# Patient Record
Sex: Female | Born: 1995 | Race: White | Hispanic: No | Marital: Single | Smoking: Former smoker
Health system: Southern US, Community
[De-identification: ages and names within clinical notes are randomized; demographics above are authoritative.]

## PROBLEM LIST (undated history)

## (undated) DIAGNOSIS — G43909 Migraine, unspecified, not intractable, without status migrainosus: Secondary | ICD-10-CM

## (undated) DIAGNOSIS — L709 Acne, unspecified: Secondary | ICD-10-CM

## (undated) DIAGNOSIS — F32A Depression, unspecified: Secondary | ICD-10-CM

## (undated) DIAGNOSIS — F329 Major depressive disorder, single episode, unspecified: Secondary | ICD-10-CM

## (undated) HISTORY — PX: WISDOM TOOTH EXTRACTION: SHX21

---
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
## 2015-04-15 ENCOUNTER — Ambulatory Visit (INDEPENDENT_AMBULATORY_CARE_PROVIDER_SITE_OTHER): Payer: No Typology Code available for payment source | Admitting: Women's Health

## 2015-04-15 ENCOUNTER — Encounter: Payer: Self-pay | Admitting: Women's Health

## 2015-04-15 VITALS — BP 112/70 | Ht 66.25 in | Wt 141.0 lb

## 2015-04-15 DIAGNOSIS — Z01419 Encounter for gynecological examination (general) (routine) without abnormal findings: Secondary | ICD-10-CM | POA: Diagnosis not present

## 2015-04-15 DIAGNOSIS — Z30018 Encounter for initial prescription of other contraceptives: Secondary | ICD-10-CM | POA: Diagnosis not present

## 2015-04-15 LAB — CBC WITH DIFFERENTIAL/PLATELET
BASOS ABS: 0 10*3/uL (ref 0.0–0.1)
Basophils Relative: 0 % (ref 0–1)
EOS ABS: 0.1 10*3/uL (ref 0.0–0.7)
EOS PCT: 1 % (ref 0–5)
HEMATOCRIT: 38.5 % (ref 36.0–46.0)
HEMOGLOBIN: 12.8 g/dL (ref 12.0–15.0)
Lymphocytes Relative: 34 % (ref 12–46)
Lymphs Abs: 2.6 10*3/uL (ref 0.7–4.0)
MCH: 27.5 pg (ref 26.0–34.0)
MCHC: 33.2 g/dL (ref 30.0–36.0)
MCV: 82.8 fL (ref 78.0–100.0)
MONOS PCT: 7 % (ref 3–12)
MPV: 10.8 fL (ref 8.6–12.4)
Monocytes Absolute: 0.5 10*3/uL (ref 0.1–1.0)
NEUTROS ABS: 4.4 10*3/uL (ref 1.7–7.7)
NEUTROS PCT: 58 % (ref 43–77)
Platelets: 328 10*3/uL (ref 150–400)
RBC: 4.65 MIL/uL (ref 3.87–5.11)
RDW: 15.3 % (ref 11.5–15.5)
WBC: 7.5 10*3/uL (ref 4.0–10.5)

## 2015-04-15 MED ORDER — ETONOGESTREL-ETHINYL ESTRADIOL 0.12-0.015 MG/24HR VA RING: VAGINAL_RING | VAGINAL | Status: DC

## 2015-04-15 NOTE — Progress Notes (Signed)
Claudia Hughes 12-30-1995 [id number]    History:    Presents for annual exam.  Regular monthly cycle Yaz in the past, stopped had trouble remembering not sexually active. Gardasil series completed. 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  Past medical history, past surgical history, family history and social history were all reviewed and documented in the EPIC chart. Freshman [employer].  ROS:  A ROS was performed and pertinent positives and negatives are included.  Exam:  Filed Vitals:   04/15/15 0818  BP: 112/70    General appearance:  Normal Thyroid:  Symmetrical, normal in size, without palpable masses or nodularity. Respiratory  Auscultation:  Clear without wheezing or rhonchi Cardiovascular  Auscultation:  Regular rate, without rubs, murmurs or gallops  Edema/varicosities:  Not grossly evident Abdominal  Soft,nontender, without masses, guarding or rebound.  Liver/spleen:  No organomegaly noted  Hernia:  None appreciated  Skin  Inspection:  Grossly normal   Breasts: Examined lying and sitting.     Right: Without masses, retractions, discharge or axillary adenopathy.     Left: Without masses, retractions, discharge or axillary adenopathy. Gentitourinary   Inguinal/mons:  Normal without inguinal adenopathy  External genitalia:  Normal  BUS/Urethra/Skene's glands:  Normal  Vagina:  Normal  Cervix:  Normal  Uterus:   normal in size, shape and contour.  Midline and mobile  Adnexa/parametria:     Rt: Without masses or tenderness.   Lt: Without masses or tenderness.  Anus and perineum: Normal    Assessment/Plan:  19 y.o. S WF G0 for annual exam.     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 Condoms encouraged if sexually active and especially first month. SBE's, exercise, calcium rich diet, MVI daily  encouraged. Campus safety reviewed. CBC, UAHarrington Challenger WHNP, 11:41 AM 04/15/2015

## 2015-04-15 NOTE — Patient Instructions (Signed)

## 2015-05-10 ENCOUNTER — Other Ambulatory Visit: Payer: Self-pay

## 2015-09-09 ENCOUNTER — Telehealth: Payer: Self-pay | Admitting: *Deleted

## 2015-09-09 DIAGNOSIS — Z30018 Encounter for initial prescription of other contraceptives: Secondary | ICD-10-CM

## 2015-09-09 MED ORDER — ETONOGESTREL-ETHINYL ESTRADIOL 0.12-0.015 MG/24HR VA RING
VAGINAL_RING | VAGINAL | Status: DC
Start: 2015-09-09 — End: 2016-04-27

## 2015-09-09 NOTE — Telephone Encounter (Signed)
Pt called requesting her birth control nuvaring be sent to local pharmacy. [facility]. Rx sent.

## 2016-04-27 ENCOUNTER — Encounter: Payer: Self-pay | Admitting: Women's Health

## 2016-04-27 ENCOUNTER — Ambulatory Visit (INDEPENDENT_AMBULATORY_CARE_PROVIDER_SITE_OTHER): Payer: BLUE CROSS/BLUE SHIELD | Admitting: Women's Health

## 2016-04-27 VITALS — BP 120/80 | Ht 66.0 in | Wt 146.0 lb

## 2016-04-27 DIAGNOSIS — Z01419 Encounter for gynecological examination (general) (routine) without abnormal findings: Secondary | ICD-10-CM

## 2016-04-27 DIAGNOSIS — Z113 Encounter for screening for infections with a predominantly sexual mode of transmission: Secondary | ICD-10-CM

## 2016-04-27 DIAGNOSIS — Z30018 Encounter for initial prescription of other contraceptives: Secondary | ICD-10-CM | POA: Diagnosis not present

## 2016-04-27 LAB — CBC WITH DIFFERENTIAL/PLATELET
BASOS ABS: 0 {cells}/uL (ref 0–200)
Basophils Relative: 0 %
EOS ABS: 50 {cells}/uL (ref 15–500)
Eosinophils Relative: 1 %
HEMATOCRIT: 39.3 % (ref 35.0–45.0)
HEMOGLOBIN: 12.8 g/dL (ref 11.7–15.5)
LYMPHS ABS: 1800 {cells}/uL (ref 850–3900)
LYMPHS PCT: 36 %
MCH: 28.1 pg (ref 27.0–33.0)
MCHC: 32.6 g/dL (ref 32.0–36.0)
MCV: 86.2 fL (ref 80.0–100.0)
MONO ABS: 350 {cells}/uL (ref 200–950)
MPV: 10.5 fL (ref 7.5–12.5)
Monocytes Relative: 7 %
NEUTROS PCT: 56 %
Neutro Abs: 2800 cells/uL (ref 1500–7800)
Platelets: 315 10*3/uL (ref 140–400)
RBC: 4.56 MIL/uL (ref 3.80–5.10)
RDW: 14.4 % (ref 11.0–15.0)
WBC: 5 10*3/uL (ref 3.8–10.8)

## 2016-04-27 MED ORDER — ETONOGESTREL-ETHINYL ESTRADIOL 0.12-0.015 MG/24HR VA RING: VAGINAL_RING | VAGINAL | 4 refills | Status: DC

## 2016-04-27 NOTE — Patient Instructions (Signed)
Health Maintenance, Female Adopting a healthy lifestyle and getting preventive care can go a long way to promote health and wellness. Talk with your health care provider about what schedule of regular examinations is right for you. This is a good chance for you to check in with your provider about disease prevention and staying healthy. In between checkups, there are plenty of things you can do on your own. Experts have done a lot of research about which lifestyle changes and preventive measures are most likely to keep you healthy. Ask your health care provider for more information. WEIGHT AND DIET  Eat a healthy diet  Be sure to include plenty of vegetables, fruits, low-fat dairy products, and lean protein.  Do not eat a lot of foods high in solid fats, added sugars, or salt.  Get regular exercise. This is one of the most important things you can do for your health.  Most adults should exercise for at least 150 minutes each week. The exercise should increase your heart rate and make you sweat (moderate-intensity exercise).  Most adults should also do strengthening exercises at least twice a week. This is in addition to the moderate-intensity exercise.  Maintain a healthy weight  Body mass index (BMI) is a measurement that can be used to identify possible weight problems. It estimates body fat based on height and weight. Your health care provider can help determine your BMI and help you achieve or maintain a healthy weight.  For females 20 years of age and older:   A BMI below 18.5 is considered underweight.  A BMI of 18.5 to 24.9 is normal.  A BMI of 25 to 29.9 is considered overweight.  A BMI of 30 and above is considered obese.  Watch levels of cholesterol and blood lipids  You should start having your blood tested for lipids and cholesterol at 20 years of age, then have this test every 5 years.  You may need to have your cholesterol levels checked more often if:  Your lipid  or cholesterol levels are high.  You are older than 20 years of age.  You are at high risk for heart disease.  CANCER SCREENING   Lung Cancer  Lung cancer screening is recommended for adults 55-80 years old who are at high risk for lung cancer because of a history of smoking.  A yearly low-dose CT scan of the lungs is recommended for people who:  Currently smoke.  Have quit within the past 15 years.  Have at least a 30-pack-year history of smoking. A pack year is smoking an average of one pack of cigarettes a day for 1 year.  Yearly screening should continue until it has been 15 years since you quit.  Yearly screening should stop if you develop a health problem that would prevent you from having lung cancer treatment.  Breast Cancer  Practice breast self-awareness. This means understanding how your breasts normally appear and feel.  It also means doing regular breast self-exams. Let your health care provider know about any changes, no matter how small.  If you are in your 20s or 30s, you should have a clinical breast exam (CBE) by a health care provider every 1-3 years as part of a regular health exam.  If you are 40 or older, have a CBE every year. Also consider having a breast X-ray (mammogram) every year.  If you have a family history of breast cancer, talk to your health care provider about genetic screening.  If you   are at high risk for breast cancer, talk to your health care provider about having an MRI and a mammogram every year.  Breast cancer gene (BRCA) assessment is recommended for women who have family members with BRCA-related cancers. BRCA-related cancers include:  Breast.  Ovarian.  Tubal.  Peritoneal cancers.  Results of the assessment will determine the need for genetic counseling and BRCA1 and BRCA2 testing. Cervical Cancer Your health care provider may recommend that you be screened regularly for cancer of the pelvic organs (ovaries, uterus, and  vagina). This screening involves a pelvic examination, including checking for microscopic changes to the surface of your cervix (Pap test). You may be encouraged to have this screening done every 3 years, beginning at age 21.  For women ages 30-65, health care providers may recommend pelvic exams and Pap testing every 3 years, or they may recommend the Pap and pelvic exam, combined with testing for human papilloma virus (HPV), every 5 years. Some types of HPV increase your risk of cervical cancer. Testing for HPV may also be done on women of any age with unclear Pap test results.  Other health care providers may not recommend any screening for nonpregnant women who are considered low risk for pelvic cancer and who do not have symptoms. Ask your health care provider if a screening pelvic exam is right for you.  If you have had past treatment for cervical cancer or a condition that could lead to cancer, you need Pap tests and screening for cancer for at least 20 years after your treatment. If Pap tests have been discontinued, your risk factors (such as having a new sexual partner) need to be reassessed to determine if screening should resume. Some women have medical problems that increase the chance of getting cervical cancer. In these cases, your health care provider may recommend more frequent screening and Pap tests. Colorectal Cancer  This type of cancer can be detected and often prevented.  Routine colorectal cancer screening usually begins at 20 years of age and continues through 20 years of age.  Your health care provider may recommend screening at an earlier age if you have risk factors for colon cancer.  Your health care provider may also recommend using home test kits to check for hidden blood in the stool.  A small camera at the end of a tube can be used to examine your colon directly (sigmoidoscopy or colonoscopy). This is done to check for the earliest forms of colorectal  cancer.  Routine screening usually begins at age 50.  Direct examination of the colon should be repeated every 5-10 years through 20 years of age. However, you may need to be screened more often if early forms of precancerous polyps or small growths are found. Skin Cancer  Check your skin from head to toe regularly.  Tell your health care provider about any new moles or changes in moles, especially if there is a change in a mole's shape or color.  Also tell your health care provider if you have a mole that is larger than the size of a pencil eraser.  Always use sunscreen. Apply sunscreen liberally and repeatedly throughout the day.  Protect yourself by wearing long sleeves, pants, a wide-brimmed hat, and sunglasses whenever you are outside. HEART DISEASE, DIABETES, AND HIGH BLOOD PRESSURE   High blood pressure causes heart disease and increases the risk of stroke. High blood pressure is more likely to develop in:  People who have blood pressure in the high end   of the normal range (130-139/85-89 mm Hg).  People who are overweight or obese.  People who are African American.  If you are 38-23 years of age, have your blood pressure checked every 3-5 years. If you are 61 years of age or older, have your blood pressure checked every year. You should have your blood pressure measured twice--once when you are at a hospital or clinic, and once when you are not at a hospital or clinic. Record the average of the two measurements. To check your blood pressure when you are not at a hospital or clinic, you can use:  An automated blood pressure machine at a pharmacy.  A home blood pressure monitor.  If you are between 45 years and 39 years old, ask your health care provider if you should take aspirin to prevent strokes.  Have regular diabetes screenings. This involves taking a blood sample to check your fasting blood sugar level.  If you are at a normal weight and have a low risk for diabetes,  have this test once every three years after 20 years of age.  If you are overweight and have a high risk for diabetes, consider being tested at a younger age or more often. PREVENTING INFECTION  Hepatitis B  If you have a higher risk for hepatitis B, you should be screened for this virus. You are considered at high risk for hepatitis B if:  You were born in a country where hepatitis B is common. Ask your health care provider which countries are considered high risk.  Your parents were born in a high-risk country, and you have not been immunized against hepatitis B (hepatitis B vaccine).  You have HIV or AIDS.  You use needles to inject street drugs.  You live with someone who has hepatitis B.  You have had sex with someone who has hepatitis B.  You get hemodialysis treatment.  You take certain medicines for conditions, including cancer, organ transplantation, and autoimmune conditions. Hepatitis C  Blood testing is recommended for:  Everyone born from [birth year] through 1965.  Anyone with known risk factors for hepatitis C. Sexually transmitted infections (STIs)  You should be screened for sexually transmitted infections (STIs) including gonorrhea and chlamydia if:  You are sexually active and are younger than 20 years of age.  You are older than 20 years of age and your health care provider tells you that you are at risk for this type of infection.  Your sexual activity has changed since you were last screened and you are at an increased risk for chlamydia or gonorrhea. Ask your health care provider if you are at risk.  If you do not have HIV, but are at risk, it may be recommended that you take a prescription medicine daily to prevent HIV infection. This is called pre-exposure prophylaxis (PrEP). You are considered at risk if:  You are sexually active and do not regularly use condoms or know the HIV status of your partner(s).  You take drugs by injection.  You are sexually  active with a partner who has HIV. Talk with your health care provider about whether you are at high risk of being infected with HIV. If you choose to begin PrEP, you should first be tested for HIV. You should then be tested every 3 months for as long as you are taking PrEP.  PREGNANCY   If you are premenopausal and you may become pregnant, ask your health care provider about preconception counseling.  If you may  become pregnant, take 400 to 800 micrograms (mcg) of folic acid every day.  If you want to prevent pregnancy, talk to your health care provider about birth control (contraception). OSTEOPOROSIS AND MENOPAUSE   Osteoporosis is a disease in which the bones lose minerals and strength with aging. This can result in serious bone fractures. Your risk for osteoporosis can be identified using a bone density scan.  If you are 61 years of age or older, or if you are at risk for osteoporosis and fractures, ask your health care provider if you should be screened.  Ask your health care provider whether you should take a calcium or vitamin D supplement to lower your risk for osteoporosis.  Menopause may have certain physical symptoms and risks.  Hormone replacement therapy may reduce some of these symptoms and risks. Talk to your health care provider about whether hormone replacement therapy is right for you.  HOME CARE INSTRUCTIONS   Schedule regular health, dental, and eye exams.  Stay current with your immunizations.   Do not use any tobacco products including cigarettes, chewing tobacco, or electronic cigarettes.  If you are pregnant, do not drink alcohol.  If you are breastfeeding, limit how much and how often you drink alcohol.  Limit alcohol intake to no more than 1 drink per day for nonpregnant women. One drink equals 12 ounces of beer, 5 ounces of wine, or 1 ounces of hard liquor.  Do not use street drugs.  Do not share needles.  Ask your health care provider for help if  you need support or information about quitting drugs.  Tell your health care provider if you often feel depressed.  Tell your health care provider if you have ever been abused or do not feel safe at home.   This information is not intended to replace advice given to you by your health care provider. Make sure you discuss any questions you have with your health care provider.   Document Released: 03/13/2011 Document Revised: 09/18/2014 Document Reviewed: 07/30/2013 Elsevier Interactive Patient Education [employer].

## 2016-04-27 NOTE — Progress Notes (Signed)
Claudia Hughes [birth year] [id number]    History:    Presents for annual exam.  Please cycle on NuvaRing without complaint. New partner. Gardasil series completed. 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  Past medical history, past surgical history, family history and social history were all reviewed and documented in the EPIC chart. Sophomore at [employer] doing well. Parents healthy.  ROS:  A ROS was performed and pertinent positives and negatives are included.  Exam:  Vitals:   04/27/16 1106  BP: 120/80  Weight: 146 lb (66.2 kg)  Height: 5\' 6"  (1.676 m)   Body mass index is 23.57 kg/m.   General appearance:  Normal Thyroid:  Symmetrical, normal in size, without palpable masses or nodularity. Respiratory  Auscultation:  Clear without wheezing or rhonchi Cardiovascular  Auscultation:  Regular rate, without rubs, murmurs or gallops  Edema/varicosities:  Not grossly evident Abdominal  Soft,nontender, without masses, guarding or rebound.  Liver/spleen:  No organomegaly noted  Hernia:  None appreciated  Skin  Inspection:  Grossly normal   Breasts: Examined lying and sitting.     Right: Without masses, retractions, discharge or axillary adenopathy.     Left: Without masses, retractions, discharge or axillary adenopathy. Gentitourinary   Inguinal/mons:  Normal without inguinal adenopathy  External genitalia:  Normal  BUS/Urethra/Skene's glands:  Normal  Vagina:  Normal  Cervix:  Normal  Uterus:   normal in size, shape and contour.  Midline and mobile  Adnexa/parametria:     Rt: Without masses or tenderness.   Lt: Without masses or tenderness.  Anus and perineum: Normal   Assessment/Plan:  20 y.o. SBF G0  for annual exam. If no complaints.  Monthly cycle on NuvaRing STD screen  Plan:  NuvaRing prescription, proper use, slight risk for blood clots and strokes reviewed. Condoms encouraged until permanent partner. SBE's, exercise, calcium rich diet, MVI daily encouraged. Campus  safety reviewed. CBC, UA, GC/Chlamydia, HIV, hep B, C, RPRHarrington Challenger.  Ethin Drummond J WHNP, 11:44 AM 04/27/2016

## 2016-04-28 LAB — HIV ANTIBODY (ROUTINE TESTING W REFLEX): HIV: NONREACTIVE

## 2016-04-28 LAB — HEPATITIS C ANTIBODY: HCV Ab: NEGATIVE

## 2016-04-28 LAB — RPR

## 2016-04-28 LAB — HEPATITIS B SURFACE ANTIGEN: Hepatitis B Surface Ag: NEGATIVE

## 2016-04-28 LAB — GC/CHLAMYDIA PROBE AMP
CT Probe RNA: NOT DETECTED
GC PROBE AMP APTIMA: NOT DETECTED

## 2016-06-11 ENCOUNTER — Emergency Department (HOSPITAL_COMMUNITY)
Admission: EM | Admit: 2016-06-11 | Discharge: 2016-06-12 | Disposition: A | Discharge status: Other Acute Inpatient Hospital | Payer: BLUE CROSS/BLUE SHIELD | Attending: Emergency Medicine | Admitting: Emergency Medicine

## 2016-06-11 ENCOUNTER — Encounter (HOSPITAL_COMMUNITY): Payer: Self-pay

## 2016-06-11 DIAGNOSIS — F17210 Nicotine dependence, cigarettes, uncomplicated: Secondary | ICD-10-CM | POA: Diagnosis not present

## 2016-06-11 DIAGNOSIS — R45851 Suicidal ideations: Secondary | ICD-10-CM

## 2016-06-11 DIAGNOSIS — F329 Major depressive disorder, single episode, unspecified: Secondary | ICD-10-CM | POA: Diagnosis not present

## 2016-06-11 HISTORY — DX: Major depressive disorder, single episode, unspecified: F32.9

## 2016-06-11 HISTORY — DX: Depression, unspecified: F32.A

## 2016-06-11 LAB — COMPREHENSIVE METABOLIC PANEL
ALK PHOS: 43 U/L (ref 38–126)
ALT: 11 U/L — AB (ref 14–54)
AST: 14 U/L — AB (ref 15–41)
Albumin: 4.2 g/dL (ref 3.5–5.0)
Anion gap: 7 (ref 5–15)
BILIRUBIN TOTAL: 0.4 mg/dL (ref 0.3–1.2)
BUN: 12 mg/dL (ref 6–20)
CALCIUM: 9.5 mg/dL (ref 8.9–10.3)
CHLORIDE: 109 mmol/L (ref 101–111)
CO2: 22 mmol/L (ref 22–32)
CREATININE: 0.55 mg/dL (ref 0.44–1.00)
GFR calc Af Amer: >60 mL/min (ref >60)
Glucose, Bld: 90 mg/dL (ref 65–99)
Potassium: 3.7 mmol/L (ref 3.5–5.1)
Sodium: 138 mmol/L (ref 135–145)
TOTAL PROTEIN: 7.7 g/dL (ref 6.5–8.1)

## 2016-06-11 LAB — SALICYLATE LEVEL: Salicylate Lvl: <4 mg/dL (ref 2.8–30.0)

## 2016-06-11 LAB — CBC
HCT: 38.1 % (ref 36.0–46.0)
Hemoglobin: 12.9 g/dL (ref 12.0–15.0)
MCH: 28.2 pg (ref 26.0–34.0)
MCHC: 33.9 g/dL (ref 30.0–36.0)
MCV: 83.4 fL (ref 78.0–100.0)
PLATELETS: 321 10*3/uL (ref 150–400)
RBC: 4.57 MIL/uL (ref 3.87–5.11)
RDW: 13.4 % (ref 11.5–15.5)
WBC: 9 10*3/uL (ref 4.0–10.5)

## 2016-06-11 LAB — ETHANOL

## 2016-06-11 LAB — ACETAMINOPHEN LEVEL: Acetaminophen (Tylenol), Serum: <10 ug/mL — ABNORMAL LOW (ref 10–30)

## 2016-06-11 MED ORDER — IBUPROFEN 200 MG PO TABS: 600.0000 mg | ORAL_TABLET | Freq: Three times a day (TID) | ORAL | Status: DC | PRN

## 2016-06-11 NOTE — BH Assessment (Signed)
Assessor attempted to contact the pt again via the TTS machine but the pt cannot be reached.  Claudia Hughes, MSW, Theresia MajorsLCSWA

## 2016-06-11 NOTE — ED Notes (Signed)
Bed: [facility] Expected date:  Expected time:  Means of arrival:  Comments: 

## 2016-06-11 NOTE — ED Triage Notes (Signed)
Patient bib her mother.  Patient states that within the last 3 weeks she has been depressed.  Patient states that came home from school because of increasing depression.  Patient states that since she has been home has stated that she would rather be "gone" instead of here.  Patient denies A/V/H, denies pain.  Patient is tearful in triage.
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
## 2016-06-11 NOTE — BH Assessment (Signed)
Assessor spoke with Claudia Hughes who requested a call back in about 10 minutes to conduct the TTS assessment.  Princess BruinsAquicha Duff, MSW, Theresia MajorsLCSWA

## 2016-06-11 NOTE — BH Assessment (Signed)
Assessor attempted to contact pt to conduct TTS assessment but was able to reach the pt. Will attempt to call back in approx 5 mins.  Princess BruinsAquicha Duff, MSW, Theresia MajorsLCSWA

## 2016-06-11 NOTE — ED Notes (Signed)
Patient changed into scrubs and ambulatory to restroom.

## 2016-06-12 ENCOUNTER — Encounter (HOSPITAL_COMMUNITY): Payer: Self-pay

## 2016-06-12 ENCOUNTER — Inpatient Hospital Stay (HOSPITAL_COMMUNITY)
Admission: AD | Admit: 2016-06-12 | Discharge: 2016-06-13 | DRG: 885 | Disposition: A | Discharge status: Home / Self Care | Payer: BLUE CROSS/BLUE SHIELD | Source: Intra-hospital | Attending: Psychiatry | Admitting: Psychiatry

## 2016-06-12 DIAGNOSIS — Z8489 Family history of other specified conditions: Secondary | ICD-10-CM | POA: Diagnosis not present

## 2016-06-12 DIAGNOSIS — F17210 Nicotine dependence, cigarettes, uncomplicated: Secondary | ICD-10-CM | POA: Diagnosis present

## 2016-06-12 DIAGNOSIS — F1210 Cannabis abuse, uncomplicated: Secondary | ICD-10-CM | POA: Diagnosis present

## 2016-06-12 DIAGNOSIS — F329 Major depressive disorder, single episode, unspecified: Secondary | ICD-10-CM | POA: Diagnosis present

## 2016-06-12 DIAGNOSIS — F322 Major depressive disorder, single episode, severe without psychotic features: Secondary | ICD-10-CM | POA: Diagnosis present

## 2016-06-12 DIAGNOSIS — R45851 Suicidal ideations: Secondary | ICD-10-CM | POA: Diagnosis present

## 2016-06-12 DIAGNOSIS — G43009 Migraine without aura, not intractable, without status migrainosus: Secondary | ICD-10-CM | POA: Diagnosis present

## 2016-06-12 DIAGNOSIS — Z30018 Encounter for initial prescription of other contraceptives: Secondary | ICD-10-CM

## 2016-06-12 DIAGNOSIS — Z79899 Other long term (current) drug therapy: Secondary | ICD-10-CM

## 2016-06-12 DIAGNOSIS — Z791 Long term (current) use of non-steroidal anti-inflammatories (NSAID): Secondary | ICD-10-CM | POA: Diagnosis not present

## 2016-06-12 LAB — TSH: TSH: 1.629 u[IU]/mL (ref 0.350–4.500)

## 2016-06-12 MED ORDER — ACETAMINOPHEN 325 MG PO TABS: 650.0000 mg | ORAL_TABLET | Freq: Four times a day (QID) | ORAL | Status: DC | PRN

## 2016-06-12 MED ORDER — TRAZODONE HCL 50 MG PO TABS
50.0000 mg | ORAL_TABLET | Freq: Every evening | ORAL | Status: DC | PRN
  Administered 2016-06-12: 50 mg via ORAL
  Filled 2016-06-12: qty 1

## 2016-06-12 MED ORDER — HYDROXYZINE HCL 25 MG PO TABS
25.0000 mg | ORAL_TABLET | Freq: Three times a day (TID) | ORAL | Status: DC | PRN
  Administered 2016-06-12 – 2016-06-13 (×3): 25 mg via ORAL
  Filled 2016-06-12 (×3): qty 1

## 2016-06-12 MED ORDER — ALUM & MAG HYDROXIDE-SIMETH 200-200-20 MG/5ML PO SUSP: 30.0000 mL | ORAL | Status: DC | PRN

## 2016-06-12 MED ORDER — MAGNESIUM HYDROXIDE 400 MG/5ML PO SUSP: 30.0000 mL | Freq: Every day | ORAL | Status: DC | PRN

## 2016-06-12 MED ORDER — CITALOPRAM HYDROBROMIDE 10 MG PO TABS
10.0000 mg | ORAL_TABLET | Freq: Every day | ORAL | Status: DC
  Administered 2016-06-12 – 2016-06-13 (×2): 10 mg via ORAL
  Filled 2016-06-12 (×4): qty 1

## 2016-06-12 NOTE — Progress Notes (Signed)
D: Claudia Hughes rates Anxiety and Depression 5/10. Her goal today was to try to feel better and not to stay in her room all day. Denies SI/HI/AVH at this time. Contracts for safety.  A: Encouragement and support given. Q15 minute room checks for patient safety. Medications administered as prescribed.  R: Continue to monitor for patient safety and medication effectiveness.

## 2016-06-12 NOTE — Progress Notes (Signed)
Recreation Therapy Notes  Date: 06/12/16  Time: 0930 Location: 300 Hall Dayroom  Group Topic: Stress Management  Goal Area(s) Addresses:  Patient will verbalize importance of using healthy stress management.  Patient will identify positive emotions associated with healthy stress management.   Intervention: Guided Imagery  Activity :  Engineer, technical saleseaceful Place Imagery.  LRT introduced to the technique of guided imagery to patients.  LRT read a script so patients could participate in the technique.  Patients were to follow along as LRT read script.  Education:  Stress Management, Discharge Planning.   Education Outcome: Acknowledges edcuation/In group clarification offered/Needs additional education  Clinical Observations/Feedback: Pt did not attend group.      Caroll RancherMarjette Jacobie Stamey, LRT/CTRS         Caroll RancherLindsay, Izan Miron A 06/12/2016 12:02 PM

## 2016-06-12 NOTE — Progress Notes (Signed)
D:  Patient's self inventory sheet, patient has fair sleep, no sleep medication given.  Good appetite, normal energy level, good concentration.  Rated depression 4, denied hopeless, anxiety 8.  Denied withdrawals.  Denied SI.  Denied physical pain.  Denied pain.  Goal is getting out and going to appointments.  Plans to talk to MD.  Does have discharge plans. A:  Medications administered per MD orders.  Emotional support and encouragement given patient. R:  Denied SI and HI, contracts for safety.  Denied A/V hallucinations.  Safety maintained with 15 minute checks.

## 2016-06-12 NOTE — [facility] Group Notes (Signed)
Adult Psychoeducational Group Note  Date:  06/12/2016 Time:  1:30 PM  Overcoming Obstacles  Participation Level:  Minimal  Participation Quality:  Minimal  Affect:  Depressed  Cognitive:  Appropriate  Insight: Improving  Engagement in Group:  Developing/Improving  Modes of Intervention:  Discussion, Exploration and Support  Additional Comments: Today's Topic: Overcoming Obstacles. Patients identified one short term goal and potential obstacles in reaching this goal. Patients processed barriers involved in overcoming these obstacles. Patients identified steps necessary for overcoming these obstacles and explored motivation (internal and external) for facing these difficulties head on. Pt encouraged to come to group by roommate, attended 10 minutes at end of group, listened quietly.    Sallee Langenne C Cunningham 06/12/2016, 5:44 PM

## 2016-06-12 NOTE — ED Notes (Signed)
[facility]

## 2016-06-12 NOTE — [facility] Suicide Risk Assessment (Signed)
[facility] Admission Suicide Risk Assessment   Nursing information obtained from:  Patient Demographic factors:  Adolescent or young adult, Unemployed, Caucasian Current Mental Status:  Self-harm thoughts Loss Factors:  NA Historical Factors:  Family history of mental illness or substance abuse Risk Reduction Factors:  Living with another person, especially a relative  Total Time spent with patient: 45 minutes Principal Problem:  MDD  Diagnosis:   Patient Active Problem List   Diagnosis Date Noted  . Major depressive disorder, single episode, severe (HCC) [F32.2] 06/12/2016  . Suicidal ideation [R45.851] 06/12/2016  . 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 Migraine without aura, without mention of intractable migraine without mention of status migrainosus [G43.009] 03/19/2013  . Episodic tension type headache [G44.219] 03/19/2013  . Variants of migraine, not elsewhere classified, without mention of intractable migraine without mention of status migrainosus [G43.809] 03/19/2013  . Acne [L70.9] 03/03/2013     Continued Clinical Symptoms:    The "Alcohol Use Disorders Identification Test", Guidelines for Use in Primary Care, Second Edition.  World Science writer [facility]). Score between 0-7:  no or low risk or alcohol related problems. Score between 8-15:  moderate risk of alcohol related problems. Score between 16-19:  high risk of alcohol related problems. Score 20 or above:  warrants further diagnostic evaluation for alcohol dependence and treatment.   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   Patient states she has been feeling increasingly depressed over recent weeks . She states that her depression started due to problems with her college roommates, whom she felt were " ganging up on her ", being overly critical, and making life difficult for her. She states she made decision to leave college  and return to parents' home last week.  States she came to hospital at the encouragement of her parents, who have been very concerned about her and about her returning to college. Reports she has been sad, often tearful, with low energy level, a sense of low self esteem. Denies any suicidal pans or intention, but states she has had thoughts such as " maybe things would be easier if I die"  This is her first psychiatric admission,denies any history of suicide attempts, denies history of self cutting, no history of psychosis, denies mania, denies panic or agoraphobia. States she was prescribed Zoloft a few years ago, for depression, but took it only briefly .   Smokes cannabis 2 x week, she has a history of binge drinking in college - up to three times a week, with some blackouts.   Denies any medical issues, NKDA.  Mother and brother have history of depression, and brother attempted suicide   Dx- MDD , no psychotic features  Plan - inpatient admission - we discussed options, agrees to antidepressant trial. Start CELEXA 10 mgrs QDAY initially. Also , at patient's request, I spoke with her father via phone. Family very involved- will try to arrange for a family meeting with patient and parents tomorrow.     Musculoskeletal: Strength & Muscle Tone: within normal limits Gait & Station: normal Patient leans: N/A  Psychiatric Specialty Exam: Physical Exam  Review of Systems  Constitutional: Negative.   HENT: Negative.   Eyes: Negative.   Respiratory: Negative.   Cardiovascular: Negative.   Gastrointestinal: Negative.   Genitourinary: Negative.   Musculoskeletal: Negative.   Skin: Negative.   Neurological: Negative for seizures.  Endo/Heme/Allergies: Negative.   Psychiatric/Behavioral:  Positive for depression and suicidal ideas.  All other systems reviewed and are negative.   Blood pressure 118/75, pulse 65, temperature 98.2 F (36.8 C), temperature source Oral, resp. rate 18, height  5\' 6"  (1.676 m), weight 145 lb (65.8 kg), last menstrual period 06/05/2016.Body mass index is 23.4 kg/m.  General Appearance: Fairly Groomed  Eye Contact:  Fair  Speech:  Normal Rate  Volume:  Decreased  Mood:  depressed, but states she is feeling better   Affect:  constricted, but briefly reactive   Thought Process:  Linear  Orientation:  Full (Time, Place, and Person)  Thought Content:  no hallucinations, no delusions, not internally preoccupied   Suicidal Thoughts:  No- at this time she denies suicidal plan or intention and contracts for safety, she has had passive SI recently, as noted above   Homicidal Thoughts:  No  Memory:  recent and remote grossly intact   Judgement:  Fair  Insight:  Fair  Psychomotor Activity:  Decreased  Concentration:  Concentration: Good and Attention Span: Good  Recall:  Good  Fund of Knowledge:  Good  Language:  Good  Akathisia:  Negative  Handed:  Right  AIMS (if indicated):     Assets:  Desire for Improvement Resilience  ADL's:  Intact  Cognition:  WNL  Sleep:  Number of Hours: 2.25      COGNITIVE FEATURES THAT CONTRIBUTE TO RISK:  Closed-mindedness and Loss of executive function    SUICIDE RISK:   Moderate:  Frequent suicidal ideation with limited intensity, and duration, some specificity in terms of plans, no associated intent, good self-control, limited dysphoria/symptomatology, some risk factors present, and identifiable protective factors, including available and accessible social support.   PLAN OF CARE: Patient will be admitted to inpatient psychiatric unit for stabilization and safety. Will provide and encourage milieu participation. Provide medication management and maked adjustments as needed.  Will follow daily.    I certify that inpatient services furnished can reasonably be expected to improve the patient's condition.  Nehemiah MassedOBOS, Eulalia Ellerman, MD 06/12/2016, 11:57 AM

## 2016-06-12 NOTE — H&P (Signed)
Psychiatric Admission Assessment Adult  Patient Identification: Claudia Hughes MRN:  [id number] Date of Evaluation:  06/13/2016 Chief Complaint:  mdd Principal Diagnosis: Major depressive disorder, single episode, severe ([place]) Diagnosis:   Patient Active Problem List   Diagnosis Date Noted  . Major depressive disorder, single episode, severe ([place]) [F32.2] 06/12/2016    Priority: High  . Suicidal ideation [R45.851] 06/12/2016  . 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 Migraine without aura, without mention of intractable migraine without mention of status migrainosus [G43.009] 03/19/2013  . Episodic tension type headache [G44.219] 03/19/2013  . Variants of migraine, not elsewhere classified, without mention of intractable migraine without mention of status migrainosus [G43.809] 03/19/2013  . Acne [L70.9] 03/03/2013   History of Present Illness: Claudia Hughes is an 20 y.o. female who presents to the ED voluntarily. Pt reports she has been feeling depressed recently after she got into an argument with her friends.  She felt isolated by them which worsened her depression.  Pt reports she has been feeling suicidal but denies a current plan. Pt denies H/I.  Patient states that she  Is seeing a therapist at [facility] that she has been seeing since the Summer.  Pt reports she left school and  moved back home about 3 weeks ago to avoid her friends.  Patient denies prior suicide attempts and became tearful when she reported that her brother attempted suicide. Pt reports she has struggled with depression for several years and reports she has never been inpatient.   Associated Signs/Symptoms: Depression Symptoms:  depressed mood, difficulty concentrating, hopelessness, anxiety, (Hypo) Manic Symptoms:  Irritable Mood, Labiality of Mood, Anxiety Symptoms:  Excessive Worry, Psychotic Symptoms:  NA PTSD Symptoms: NA Total Time spent with patient: 30  minutes  Past Psychiatric History: see HPI  Is the patient at risk to self? Yes.    Has the patient been a risk to self in the past 6 months? No.  Has the patient been a risk to self within the distant past? No.  Is the patient a risk to others? Yes.    Has the patient been a risk to others in the past 6 months? Yes.    Has the patient been a risk to others within the distant past? No.   Prior Inpatient Therapy:   Prior Outpatient Therapy:    Alcohol Screening: 1. How often do you have a drink containing alcohol?: 4 or more times a week 2. How many drinks containing alcohol do you have on a typical day when you are drinking?: 1 or 2 3. How often do you have six or more drinks on one occasion?: Less than monthly Preliminary Score: 1 Brief Intervention: AUDIT score less than 7 or less-screening does not suggest unhealthy drinking-brief intervention not indicated Substance Abuse History in the last 12 months:  Yes.   Consequences of Substance Abuse: NA Previous Psychotropic Medications: Yes  Psychological Evaluations: Yes  Past Medical History:  Past Medical History:  Diagnosis Date  . Acne   . Depression   . Migraines     Past Surgical History:  Procedure Laterality Date  . WISDOM TOOTH EXTRACTION     Family History:  Family History  Problem Relation Age of Onset  . Migraines Mother   . Migraines Other     Maternal Event organiser   Family Psychiatric  History: see HPI Tobacco Screening: Have you used any form of tobacco in the last 30 days? (Cigarettes, Smokeless Tobacco, Cigars,  and/or Pipes): Yes Tobacco use, Select all that apply: 4 or less cigarettes per day Are you interested in Tobacco Cessation Medications?: No, patient refused Counseled patient on smoking cessation including recognizing danger situations, developing coping skills and basic information about quitting provided: Refused/Declined practical counseling Social History:  History  Alcohol Use No      History  Drug Use  . Types: Marijuana    Comment: pt reports smokes a few times a week.    Additional Social History: Marital status: Single Does patient have children?: No    Allergies:  No Known Allergies Lab Results:  Results for orders placed or performed during the hospital encounter of 06/12/16 (from the past 48 hour(s))  TSH     Status: None   Collection Time: 06/12/16  6:22 AM  Result Value Ref Range   TSH 1.629 0.350 - 4.500 uIU/mL    Comment: Performed at [facility]    Blood Alcohol level:  Lab Results  Component Value Date   [facility] <5 77/41/2878    Metabolic Disorder Labs:  No results found for: HGBA1C, MPG No results found for: PROLACTIN No results found for: CHOL, TRIG, HDL, CHOLHDL, VLDL, LDLCALC  Current Medications: Current Facility-Administered Medications  Medication Dose Route Frequency Provider Last Rate Last Dose  . acetaminophen (TYLENOL) tablet 650 mg  650 mg Oral Q6H PRN Rozetta Nunnery, NP      . alum & mag hydroxide-simeth (MAALOX/MYLANTA) 200-200-20 MG/5ML suspension 30 mL  30 mL Oral Q4H PRN Rozetta Nunnery, NP      . citalopram (CELEXA) tablet 10 mg  10 mg Oral Daily Jenne Campus, MD   10 mg at 06/13/16 0805  . hydrOXYzine (ATARAX/VISTARIL) tablet 25 mg  25 mg Oral TID PRN Rozetta Nunnery, NP   25 mg at 06/13/16 0807  . magnesium hydroxide (MILK OF MAGNESIA) suspension 30 mL  30 mL Oral Daily PRN Rozetta Nunnery, NP      . traZODone (DESYREL) tablet 50 mg  50 mg Oral QHS PRN Laverle Hobby, PA-C   50 mg at 06/12/16 2214   PTA Medications: Prescriptions Prior to Admission  Medication Sig Dispense Refill Last Dose  . ibuprofen (ADVIL,MOTRIN) 200 MG tablet Take 800 mg by mouth every 6 (six) hours as needed for moderate pain.   06/11/2016 at Unknown time  . SUMAtriptan (IMITREX) 50 MG tablet TAKE ONE TABLET WITH 400 MG OF IBUPROFEN AT THE ONSET OF MIGRAINE 12 tablet 5 unknown  . [DISCONTINUED] etonogestrel-ethinyl estradiol  (NUVARING) 0.12-0.015 MG/24HR vaginal ring Insert vaginally and leave in place for 3 consecutive weeks, then remove for 1 week. 3 each 4 2 weeks   Musculoskeletal: Strength & Muscle Tone: within normal limits Gait & Station: normal Patient leans: N/A  Psychiatric Specialty Exam: Physical Exam  Nursing note and vitals reviewed. Psychiatric: Her mood appears anxious. Thought content is not paranoid. She exhibits a depressed mood. She expresses no homicidal ideation.    Review of Systems  Constitutional: Negative.   HENT: Negative.   Eyes: Negative.   Respiratory: Negative.   Cardiovascular: Negative.   Gastrointestinal: Negative.   Genitourinary: Negative.   Musculoskeletal: Negative.   Skin: Negative.   Neurological: Negative.   Endo/Heme/Allergies: Negative.   Psychiatric/Behavioral: Positive for depression.  All other systems reviewed and are negative.   Blood pressure 107/69, pulse 78, temperature 98.6 F (37 C), temperature source Oral, resp. rate 16, height _0  (1.676 m), weight 65.8 kg (145  lb), last menstrual period 06/05/2016.Body mass index is 23.4 kg/m.  General Appearance: Fairly Groomed  Eye Contact:  Fair  Speech:  Normal Rate  Volume:  Decreased  Mood:  Depressed  Affect:  Constricted  Thought Process:  Linear  Orientation:  Full (Time, Place, and Person)  Thought Content:  Rumination  Suicidal Thoughts:  No  Homicidal Thoughts:  No  Memory:  Immediate;   Fair Recent;   Fair Remote;   Fair  Judgement:  Fair  Insight:  Fair  Psychomotor Activity:  Decreased  Concentration:  Concentration: Good and Attention Span: Good  Recall:  Good  Fund of Knowledge:  Good  Language:  Good  Akathisia:  Negative  Handed:  Right  AIMS (if indicated):     Assets:  Desire for Improvement Resilience  ADL's:  Intact  Cognition:  WNL  Sleep:  Number of Hours: 6.5   Treatment Plan Summary: Admit for crisis management and mood stabilization. Medication management  to re-stabilize current mood symptoms Group counseling sessions for coping skills Medical consults as needed Review and reinstate any pertinent home medications for other health problems  Observation Level/Precautions:  15 minute checks  Laboratory:  per ED  Psychotherapy:  group  Medications:  Start CELEXA 10 mgrs QDAY   Consultations:  As needed  Discharge Concerns:  safety  Estimated LOS:  2-7 days  Other:     Physician Treatment Plan for Primary Diagnosis: Major depressive disorder, single episode, severe ([place]) Long Term Goal(s): Improvement in symptoms so as ready for discharge  Short Term Goals: Ability to identify changes in lifestyle to reduce recurrence of condition will improve, Ability to verbalize feelings will improve, Ability to disclose and discuss suicidal ideas, Ability to demonstrate self-control will improve, Ability to identify and develop effective coping behaviors will improve, Ability to maintain clinical measurements within normal limits will improve, Compliance with prescribed medications will improve and Ability to identify triggers associated with substance abuse/mental health issues will improve  Physician Treatment Plan for Secondary Diagnosis: Principal Problem:   Major depressive disorder, single episode, severe ([place]) Active Problems:   Suicidal ideation  Long Term Goal(s): Improvement in symptoms so as ready for discharge  Short Term Goals: Ability to identify changes in lifestyle to reduce recurrence of condition will improve, Ability to verbalize feelings will improve, Ability to disclose and discuss suicidal ideas, Ability to demonstrate self-control will improve, Ability to identify and develop effective coping behaviors will improve, Ability to maintain clinical measurements within normal limits will improve, Compliance with prescribed medications will improve and Ability to identify triggers associated with substance abuse/mental health issues will  improve  I certify that inpatient services furnished can reasonably be expected to improve the patient's condition.    Janett Labella, NP [facility] 10/3/20174:00 PM   I have discussed case with NP and have met with patient  Agree with NP note and assessment  19 year of single female, no children, college student ( [employer] ), was living in dorm.   Patient states she has been feeling increasingly depressed over recent weeks . She states that her depression started due to problems with her college roommates, whom she felt were " ganging up on her ", being overly critical, and making life difficult for her. She states she made decision to leave college and return to parents' home last week.  States she came to hospital at the encouragement of her parents, who have been very concerned about her and about her returning to  college. Reports she has been sad, often tearful, with low energy level, a sense of low self esteem. Denies any suicidal pans or intention, but states she has had thoughts such as " maybe things would be easier if I die"  This is her first psychiatric admission,denies any history of suicide attempts, denies history of self cutting, no history of psychosis, denies mania, denies panic or agoraphobia. States she was prescribed Zoloft a few years ago, for depression, but took it only briefly .   Smokes cannabis 2 x week, she has a history of binge drinking in college - up to three times a week, with some blackouts.   Denies any medical issues, NKDA.  Mother and brother have history of depression, and brother attempted suicide   Dx- MDD , no psychotic features  Plan - inpatient admission - we discussed options, agrees to antidepressant trial. Start CELEXA 10 mgrs QDAY initially. Also , at patient's request, I spoke with her father via phone. Family very involved- will try to arrange for a family meeting with patient and parents tomorrow.

## 2016-06-12 NOTE — Tx Team (Signed)
Interdisciplinary Treatment and Diagnostic Plan Update  06/12/2016 Time of Session: 10:00 AM Claudia Hughes MRN: [id number]  Principal Diagnosis: Major Depressive Disorder  Secondary Diagnoses: Active Problems:   Major depressive disorder, single episode, severe (HCC)   Suicidal ideation   Current Medications:  Current Facility-Administered Medications  Medication Dose Route Frequency Provider Last Rate Last Dose  . acetaminophen (TYLENOL) tablet 650 mg  650 mg Oral Q6H PRN Jackelyn Poling, NP      . alum & mag hydroxide-simeth (MAALOX/MYLANTA) 200-200-20 MG/5ML suspension 30 mL  30 mL Oral Q4H PRN Jackelyn Poling, NP      . citalopram (CELEXA) tablet 10 mg  10 mg Oral Daily Craige Cotta, MD   10 mg at 06/12/16 1330  . hydrOXYzine (ATARAX/VISTARIL) tablet 25 mg  25 mg Oral TID PRN Jackelyn Poling, NP   25 mg at 06/12/16 1353  . magnesium hydroxide (MILK OF MAGNESIA) suspension 30 mL  30 mL Oral Daily PRN Jackelyn Poling, NP       PTA Medications: Prescriptions Prior to Admission  Medication Sig Dispense Refill Last Dose  . etonogestrel-ethinyl estradiol (NUVARING) 0.12-0.015 MG/24HR vaginal ring Insert vaginally and leave in place for 3 consecutive weeks, then remove for 1 week. 3 each 4 2 weeks  . ibuprofen (ADVIL,MOTRIN) 200 MG tablet Take 800 mg by mouth every 6 (six) hours as needed for moderate pain.   06/11/2016 at Unknown time  . SUMAtriptan (IMITREX) 50 MG tablet TAKE ONE TABLET WITH 400 MG OF IBUPROFEN AT THE ONSET OF MIGRAINE 12 tablet 5 unknown    Patient Stressors: Educational concerns Substance abuse  Patient Strengths: Ability for insight Average or above average intelligence Capable of independent living Motivation for treatment/growth Supportive family/friends  Treatment Modalities: Medication Management, Group therapy, Case management,  1 to 1 session with clinician, Psychoeducation, Recreational therapy.   Physician Treatment Plan for Primary Diagnosis:  Major Depressive disorder  Long Term Goal(s): Improvement in symptoms so as ready for discharge Improvement in symptoms so as ready for discharge   Short Term Goals: Ability to identify changes in lifestyle to reduce recurrence of condition will improve Ability to verbalize feelings will improve Ability to disclose and discuss suicidal ideas Ability to demonstrate self-control will improve Ability to identify and develop effective coping behaviors will improve Ability to maintain clinical measurements within normal limits will improve Compliance with prescribed medications will improve Ability to identify triggers associated with substance abuse/mental health issues will improve Ability to identify changes in lifestyle to reduce recurrence of condition will improve Ability to verbalize feelings will improve Ability to disclose and discuss suicidal ideas Ability to demonstrate self-control will improve Ability to identify and develop effective coping behaviors will improve Ability to maintain clinical measurements within normal limits will improve Compliance with prescribed medications will improve Ability to identify triggers associated with substance abuse/mental health issues will improve  Medication Management: Evaluate patient's response, side effects, and tolerance of medication regimen.  Therapeutic Interventions: 1 to 1 sessions, Unit Group sessions and Medication administration.  Evaluation of Outcomes: Progressing  Physician Treatment Plan for Secondary Diagnosis: Active Problems:   Major depressive disorder, single episode, severe (HCC)   Suicidal ideation  Long Term Goal(s): Improvement in symptoms so as ready for discharge Improvement in symptoms so as ready for discharge   Short Term Goals: Ability to identify changes in lifestyle to reduce recurrence of condition will improve Ability to verbalize feelings will improve Ability to disclose and discuss  suicidal  ideas Ability to demonstrate self-control will improve Ability to identify and develop effective coping behaviors will improve Ability to maintain clinical measurements within normal limits will improve Compliance with prescribed medications will improve Ability to identify triggers associated with substance abuse/mental health issues will improve Ability to identify changes in lifestyle to reduce recurrence of condition will improve Ability to verbalize feelings will improve Ability to disclose and discuss suicidal ideas Ability to demonstrate self-control will improve Ability to identify and develop effective coping behaviors will improve Ability to maintain clinical measurements within normal limits will improve Compliance with prescribed medications will improve Ability to identify triggers associated with substance abuse/mental health issues will improve     Medication Management: Evaluate patient's response, side effects, and tolerance of medication regimen.  Therapeutic Interventions: 1 to 1 sessions, Unit Group sessions and Medication administration.  Evaluation of Outcomes: Progressing   RN Treatment Plan for Primary Diagnosis: <principal problem not specified> Long Term Goal(s): Knowledge of disease and therapeutic regimen to maintain health will improve  Short Term Goals: Ability to verbalize frustration and anger appropriately will improve, Ability to demonstrate self-control and Ability to identify and develop effective coping behaviors will improve  Medication Management: RN will administer medications as ordered by provider, will assess and evaluate patient's response and provide education to patient for prescribed medication. RN will report any adverse and/or side effects to prescribing provider.  Therapeutic Interventions: 1 on 1 counseling sessions, Psychoeducation, Medication administration, Evaluate responses to treatment, Monitor vital signs and CBGs as ordered,  Perform/monitor CIWA, COWS, AIMS and Fall Risk screenings as ordered, Perform wound care treatments as ordered.  Evaluation of Outcomes: Progressing   LCSW Treatment Plan for Primary Diagnosis: Major Depressive disorder  Long Term Goal(s): Safe transition to appropriate next level of care at discharge, Engage patient in therapeutic group addressing interpersonal concerns.  Short Term Goals: Engage patient in aftercare planning with referrals and resources, Increase ability to appropriately verbalize feelings and Increase emotional regulation  Therapeutic Interventions: Assess for all discharge needs, 1 to 1 time with Social worker, Explore available resources and support systems, Assess for adequacy in community support network, Educate family and significant other(s) on suicide prevention, Complete Psychosocial Assessment, Interpersonal group therapy.  Evaluation of Outcomes: Progressing   Progress in Treatment: Attending groups: Yes. Participating in groups: No. Taking medication as prescribed: Yes. Toleration medication: Yes. Family/Significant other contact made: No, will contact:  collateral w patient permission Patient understands diagnosis: Yes. Discussing patient identified problems/goals with staff: Yes. Medical problems stabilized or resolved: Yes. Denies suicidal/homicidal ideation: No. and As evidenced by:  admitted w suicidal ideation, inadequate coping skills and social stressors present Issues/concerns per patient self-inventory: No. Other: na  New problem(s) identified: No, Describe:  none at this time  New Short Term/Long Term Goal(s):  Discharge Plan or Barriers:   Reason for Continuation of Hospitalization: Depression Medication stabilization Suicidal ideation  Estimated Length of Stay: 3 - 5 days  Attendees: Patient: 06/12/2016 6:17 PM  Physician: Sallyanne HaversF Cobos MD 06/12/2016 6:17 PM  Nursing: Mikki HarborMarian F, RN, Coralie Commonan M RN, [facility] RN 06/12/2016 6:17 PM  RN Care  Manager: Sondra BargesJ Clark RN CM 06/12/2016 6:17 PM  Social Worker: Governor RooksA Korah Hufstedler LCSW 06/12/2016 6:17 PM  Recreational Therapist:  06/12/2016 6:17 PM  Other:  06/12/2016 6:17 PM  Other:  06/12/2016 6:17 PM  Other: 06/12/2016 6:17 PM    Scribe for Treatment Team: Sallee LangeAnne C Clayvon Parlett, LCSW 06/12/2016 6:17 PM

## 2016-06-12 NOTE — BH Assessment (Signed)
[facility] Accepting Doctor is Dr. Marilynn LatinoKobos. Dietrich PatesStephanie A Stewart, RN has been notified. Per [facility], [facility].  Princess BruinsAquicha Duff, MSW, Theresia MajorsLCSWA

## 2016-06-12 NOTE — Plan of Care (Signed)
Problem: Education: Goal: Knowledge of [place] [facility] Education information/materials will improve Outcome: Progressing Nurse discussed depression/anxiety/coping skills with patient.    

## 2016-06-12 NOTE — Tx Team (Signed)
Initial Treatment Plan 06/12/2016 4:34 AM Claudia LloydAllison T Hughes [id number]    PATIENT STRESSORS: Educational concerns Substance abuse   PATIENT STRENGTHS: Ability for insight Average or above average intelligence Capable of independent living Motivation for treatment/growth Supportive family/friends   PATIENT IDENTIFIED PROBLEMS: Depression  Anxiety      "To stop being depressed and anxious all the time."             DISCHARGE CRITERIA:  Medical problems require only outpatient monitoring  PRELIMINARY DISCHARGE PLAN: Return to previous living arrangement  PATIENT/FAMILY INVOLVEMENT: This treatment plan has been presented to and reviewed with the patient, Claudia Hughes, and/or family member.  The patient and family have been given the opportunity to ask questions and make suggestions.  Claudia Hughes, Claudia Avena, RN 06/12/2016, 4:34 AM

## 2016-06-12 NOTE — ED Notes (Signed)
[facility] here to transport patient,.
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
## 2016-06-13 MED ORDER — HYDROXYZINE HCL 25 MG PO TABS: 25.0000 mg | ORAL_TABLET | Freq: Three times a day (TID) | ORAL | 0 refills | Status: DC | PRN

## 2016-06-13 MED ORDER — ETONOGESTREL-ETHINYL ESTRADIOL 0.12-0.015 MG/24HR VA RING: VAGINAL_RING | VAGINAL | 4 refills | Status: DC

## 2016-06-13 MED ORDER — TRAZODONE HCL 50 MG PO TABS: 50.0000 mg | ORAL_TABLET | Freq: Every evening | ORAL | 0 refills | Status: DC | PRN

## 2016-06-13 MED ORDER — CITALOPRAM HYDROBROMIDE 10 MG PO TABS: 10.0000 mg | ORAL_TABLET | Freq: Every day | ORAL | 0 refills | Status: DC
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
## 2016-06-13 NOTE — Progress Notes (Signed)
Discharge note: Pt received both written and verbal discharge instructions. Pt verbalized understanding of d/c instructions. Pt agreed to f/u appt and med regimen. Pt received prescriptions, AVS, Transition record and belongings from room and locker. Pt safely discharged to lobby. Pt picked up by parents.

## 2016-06-13 NOTE — [facility] Suicide Risk Assessment (Signed)
[facility] INPATIENT:  Family/Significant Other Suicide Prevention Education  Suicide Prevention Education:  Education Completed; Acupuncturistcott and Claudia Hughes, Pt's parents,  has been identified by the patient as the family member/significant other with whom the patient will be residing, and identified as the person(s) who will aid the patient in the event of a mental health crisis (suicidal ideations/suicide attempt).  With written consent from the patient, the family member/significant other has been provided the following suicide prevention education, prior to the and/or following the discharge of the patient.  The suicide prevention education provided includes the following:  Suicide risk factors  Suicide prevention and interventions  National Suicide Hotline telephone number  [facility] assessment telephone number  [facility] Emergency Assistance 911  [facility] and/or Residential Mobile Crisis Unit telephone number  Request made of family/significant other to:  Remove weapons (e.g., guns, rifles, knives), all items previously/currently identified as safety concern.    Remove drugs/medications (over-the-counter, prescriptions, illicit drugs), all items previously/currently identified as a safety concern.  The family member/significant other verbalizes understanding of the suicide prevention education information provided.  The family member/significant other agrees to remove the items of safety concern listed above.  Claudia HoopsLauren M Carter 06/13/2016, 3:46 PM

## 2016-06-13 NOTE — Progress Notes (Signed)
D: Pt presents with flat affect and depressed mood. Pt rates depression 4/10. Anxiety 5/10. Pt reported feeling anxious this morning and requested Vistaril. Vistaril given for anxiety at pt request. Pt stated that Vistaril helps to relieve her anxiety. Pt denies any triggers. Pt requesting to be discharged home with her family. Pt verbalized to Clinical research associatewriter that she have a family session with MD at 1430. Pt appears to have minimal insight for treatment and forwards little information during shift assessment. Pt compliant with taking meds. No side effects to meds verbalized by pt.  A: Medications reviewed with pt. Medications administered as ordered per MD. Verbal support provided. Pt encouraged to attend groups. 15 minute checks performed for safety. R: Pt receptive to tx.

## 2016-06-13 NOTE — Discharge Summary (Signed)
Physician Discharge Summary Note  Patient:  Claudia Hughes is an 20 y.o., female MRN:  [id number] DOB:  September 07, 1996 Patient phone:  [phone] (home)  Patient address:   [address] [place] [address],  Total Time spent with patient: 30 minutes  Date of Admission:  06/12/2016 Date of Discharge: 06/13/2016  Reason for Admission:  Worsening depression  Principal Problem: Major depressive disorder, single episode, severe [facility]) Discharge Diagnoses: Patient Active Problem List   Diagnosis Date Noted  . Major depressive disorder, single episode, severe (HCC) [F32.2] 06/12/2016    Priority: High  . Suicidal ideation [R45.851] 06/12/2016  . 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 Migraine without aura, without mention of intractable migraine without mention of status migrainosus [G43.009] 03/19/2013  . Episodic tension type headache [G44.219] 03/19/2013  . Variants of migraine, not elsewhere classified, without mention of intractable migraine without mention of status migrainosus [G43.809] 03/19/2013  . Acne [L70.9] 03/03/2013    Past Psychiatric History: see HPI  Past Medical History:  Past Medical History:  Diagnosis Date  . Acne   . Depression   . Migraines     Past Surgical History:  Procedure Laterality Date  . WISDOM TOOTH EXTRACTION     Family History:  Family History  Problem Relation Age of Onset  . Migraines Mother   . Migraines Other     Maternal Programme researcher, broadcasting/film/video   Family Psychiatric  History: see HPI Social History:  History  Alcohol Use No     History  Drug Use  . Types: Marijuana    Comment: pt reports smokes a few times a week.    Social History   Social History  . Marital status: Single    Spouse name: N/A  . Number of children: N/A  . Years of education: N/A   Social History Main Topics  . Smoking status: Current Every Day Smoker    Packs/day: 0.25    Years: 0.50    Types: Cigarettes  . Smokeless  tobacco: Never Used  . Alcohol use No  . Drug use:     Types: Marijuana     Comment: pt reports smokes a few times a week.  Marland Kitchen Sexual activity: Yes    Birth control/ protection: Condom, Pill     Comment: 1st intercourse- 17, partners- 2   Other Topics Concern  . None   Social History Narrative  . None    Hospital Course:  Claudia Hughes an 20 y.o.femalewho presents to the ED voluntarily. Pt reports she has been feeling depressed recently after she got into an argument with her friends.   Claudia BARRERO was admitted for Major depressive disorder, single episode, severe (HCC) and crisis management.  Patient was treated with medications with their indications listed below in detail under Medication List.  Medical problems were identified and treated as needed.  Home medications were restarted as appropriate.  Improvement was monitored by observation and Claudia Hughes daily report of symptom reduction.  Emotional and mental status was monitored by daily self inventory reports completed by Claudia Hughes and clinical staff.  Patient reported continued improvement, denied any new concerns.  Patient had been compliant on medications and denied side effects.  Support and encouragement was provided.    Patient encouraged to attend groups to help with recognizing triggers of emotional crises and de-stabilizations.  Patient encouraged to attend group to help identify the positive things in life that would help  in dealing with feelings of loss, depression and unhealthy or abusive tendencies.         Claudia Hughes was evaluated by the treatment team for stability and plans for continued recovery upon discharge.  Patient was offered further treatment options upon discharge including Residential, Intensive Outpatient and Outpatient treatment. Patient will follow up with agency listed below for medication management and counseling.  Encouraged patient to maintain satisfactory  support network and home environment.  Advised to adhere to medication compliance and outpatient treatment follow up.  Prescriptions provided.       Claudia Hughes motivation was an integral factor for scheduling further treatment.  Employment, transportation, bed availability, health status, family support, and any pending legal issues were also considered during patient's hospital stay.  Upon completion of this admission the patient was both mentally and medically stable for discharge denying suicidal/homicidal ideation, auditory/visual/tactile hallucinations, delusional thoughts and paranoia.      Physical Findings: AIMS: Facial and Oral Movements Muscles of Facial Expression: None, normal Lips and Perioral Area: None, normal Jaw: None, normal Tongue: None, normal,Extremity Movements Upper (arms, wrists, hands, fingers): None, normal Lower (legs, knees, ankles, toes): None, normal, Trunk Movements Neck, shoulders, hips: None, normal, Overall Severity Severity of abnormal movements (highest score from questions above): None, normal Incapacitation due to abnormal movements: None, normal Patient's awareness of abnormal movements (rate only patient's report): No Awareness, Dental Status Current problems with teeth and/or dentures?: No Does patient usually wear dentures?: No  CIWA:  CIWA-Ar Total: 1 COWS:  COWS Total Score: 0  Musculoskeletal: Strength & Muscle Tone: within normal limits Gait & Station: normal Patient leans: N/A  Psychiatric Specialty Exam: Physical Exam  Nursing note and vitals reviewed. Psychiatric: She has a normal mood and affect. Her speech is normal and behavior is normal. Judgment and thought content normal. Cognition and memory are normal.    Review of Systems  Constitutional: Negative.   HENT: Negative.   Eyes: Negative.   Respiratory: Negative.   Cardiovascular: Negative.   Gastrointestinal: Negative.   Genitourinary: Negative.   Musculoskeletal:  Negative.   Skin: Negative.   Neurological: Negative.   Endo/Heme/Allergies: Negative.   Psychiatric/Behavioral: Negative.     Blood pressure 107/69, pulse 78, temperature 98.6 F (37 C), temperature source Oral, resp. rate 16, height 5\' 6"  (1.676 m), weight 65.8 kg (145 lb), last menstrual period 06/05/2016.Body mass index is 23.4 kg/m.    Have you used any form of tobacco in the last 30 days? (Cigarettes, Smokeless Tobacco, Cigars, and/or Pipes): Yes  Has this patient used any form of tobacco in the last 30 days? (Cigarettes, Smokeless Tobacco, Cigars, and/or Pipes) Yes, N/A  Blood Alcohol level:  Lab Results  Component Value Date   ETH <5 06/11/2016    Metabolic Disorder Labs:  No results found for: HGBA1C, MPG No results found for: PROLACTIN No results found for: CHOL, TRIG, HDL, CHOLHDL, VLDL, LDLCALC  See Psychiatric Specialty Exam and Suicide Risk Assessment completed by Attending Physician prior to discharge.  Discharge destination:  Home  Is patient on multiple antipsychotic therapies at discharge:  No   Has Patient had three or more failed trials of antipsychotic monotherapy by history:  No  Recommended Plan for Multiple Antipsychotic Therapies: NA     Medication List    STOP taking these medications   ibuprofen 200 MG tablet Commonly known as:  ADVIL,MOTRIN   SUMAtriptan 50 MG tablet Commonly known as:  IMITREX     TAKE  these medications     Indication  citalopram 10 MG tablet Commonly known as:  CELEXA Take 1 tablet (10 mg total) by mouth daily. Start taking on:  06/14/2016  Indication:  Depression   etonogestrel-ethinyl estradiol 0.12-0.015 MG/24HR vaginal ring Commonly known as:  NUVARING Insert vaginally and leave in place for 3 consecutive weeks, then remove for 1 week.  Indication:  Birth Control   hydrOXYzine 25 MG tablet Commonly known as:  ATARAX/VISTARIL Take 1 tablet (25 mg total) by mouth 3 (three) times daily as needed for  anxiety.  Indication:  Anxiety Neurosis   traZODone 50 MG tablet Commonly known as:  DESYREL Take 1 tablet (50 mg total) by mouth at bedtime as needed for sleep.  Indication:  Anxiety Disorder      Follow-up Information    [facility] Follow up on 06/15/2016.   Specialty:  Behavioral Health Why:  at 10:30am with Corie ChiquitoJessica Carter for medication management. Contact information: [address] [place] [address] [phone]        [facility] Follow up on 06/20/2016.   Why:  at 12:30pm for therapy with Melton Krebsracy Collins. Contact information: [address] [place] [address] [phone]          Follow-up recommendations:  Activity:  as tol Diet:  as tol  Comments:  1.  Take all your medications as prescribed.   2.  Report any adverse side effects to outpatient provider. 3.  Patient instructed to not use alcohol or illegal drugs while on prescription medicines. 4.  In the event of worsening symptoms, instructed patient to call 911, the crisis hotline or go to nearest emergency room for evaluation of symptoms.  Signed: Lindwood QuaSheila May Agustin, NP [facility] 06/13/2016, 4:00 PM   Patient seen, Suicide Assessment Completed.  Disposition Plan Reviewed

## 2016-06-13 NOTE — [facility] Suicide Risk Assessment (Signed)
[facility] Discharge Suicide Risk Assessment   Principal Problem: Major depressive disorder, single episode, severe [facility](HCC) Discharge Diagnoses:  Patient Active Problem List   Diagnosis Date Noted  . Major depressive disorder, single episode, severe (HCC) [F32.2] 06/12/2016  . Suicidal ideation [R45.851] 06/12/2016  . 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 Migraine without aura, without mention of intractable migraine without mention of status migrainosus [G43.009] 03/19/2013  . Episodic tension type headache [G44.219] 03/19/2013  . Variants of migraine, not elsewhere classified, without mention of intractable migraine without mention of status migrainosus [G43.809] 03/19/2013  . Acne [L70.9] 03/03/2013    Total Time spent with patient: 30 minutes  Musculoskeletal: Strength & Muscle Tone: within normal limits Gait & Station: normal Patient leans: N/A  Psychiatric Specialty Exam: ROS denies headache, denies chest pain, no shortness of breath, no nausea, no vomiting  Blood pressure 107/69, pulse 78, temperature 98.6 F (37 C), temperature source Oral, resp. rate 16, height 5\' 6"  (1.676 m), weight 145 lb (65.8 kg), last menstrual period 06/05/2016.Body mass index is 23.4 kg/m.  General Appearance: improved grooming   Eye Contact::  Good  Speech:  Normal Rate409  Volume:  Normal  Mood:  improved mood , states she feels better today  Affect:  becoming more reactive   Thought Process:  Linear  Orientation:  Full (Time, Place, and Person)  Thought Content:  no hallucinations, no delusions, not internally preoccupied   Suicidal Thoughts:  No- at this time denies suicidal or self injurious ideations  Homicidal Thoughts:  No- denies any homicidal or violent ideations  Memory:  Recent and remote grossly intact   Judgement:  Improving   Insight:  improving  Psychomotor Activity:  Normal  Concentration:  Good  Recall:  Good  Fund of Knowledge:Good  Language: Good   Akathisia:  Negative  Handed:  Right  AIMS (if indicated):     Assets:  Communication Skills Resilience  Sleep:  Number of Hours: 6.5  Cognition: WNL  ADL's:  Intact   Mental Status Per Nursing Assessment::   On Admission:  Self-harm thoughts  Demographic Factors:  20 year old single female, college student   Loss Factors: Difficulties with roommates in college   Historical Factors: History of depression, history of anxiety, characterized mostly as social anxiety, history of cannabis abuse   Risk Reduction Factors:   Sense of responsibility to family, Living with another person, especially a relative, Positive social support and Positive coping skills or problem solving skills  Continued Clinical Symptoms:  At this time patient is alert, attentive, better groomed, good eye contact. Mood remains depressed but overall has improved and affect is more reactive, smiles at times appropriately, no thought disorder, no suicidal or self injurious ideations, no homicidal ideations, no hallucinations, no delusions, not internally preoccupied . Future oriented, states she has given her situation further thought and at this time is planning on withdrawing her academic semester and remain at home with parents, get a job . Prior to her discharge we had family meeting with parents, patient, Clinical research associatewriter. Parents are very supportive , and stated they would prefer for her to stay at home for a few months, focusing on improving , before returning to college- at this time patient agrees . We also discussed the importance of outpatient treatment compliance- she has a therapist she has good rapport with , and the importance of cannabis abstinence as integral part of treatment goals . We reviewed potential side effects of CELEXA  and reviewed potential for antidepressants to increase risk of suicidal ideations early in treatment in young adults . Of note, both patient and her parents are wanting her to discharge  today, states she would feel more comfortable at home . Parents very supportive.  Cognitive Features That Contribute To Risk:  No gross cognitive deficits noted upon discharge. Is alert , attentive, and oriented x 3   Suicide Risk:  Mild:  Suicidal ideation of limited frequency, intensity, duration, and specificity.  There are no identifiable plans, no associated intent, mild dysphoria and related symptoms, good self-control (both objective and subjective assessment), few other risk factors, and identifiable protective factors, including available and accessible social support.  Follow-up Information    [facility] Follow up on 06/15/2016.   Specialty:  Behavioral Health Why:  at 10:30am with Corie Chiquito for medication management. Contact information: [address] [place] [address] [phone]        [facility] Follow up on 06/20/2016.   Why:  at 12:30pm for therapy with Melton Krebs. Contact information: [address] [place] [address] [phone]          Plan Of Care/Follow-up recommendations:  Activity:  as tolerated  Diet:  Regular Tests:  NA Other:  See below   Patient is requesting discharge- no current grounds for involuntary commitment She is returning home with her parents  Follow up as above    Nehemiah Massed, MD 06/13/2016, 4:00 PM

## 2016-06-13 NOTE — Progress Notes (Signed)
  [facility] Discharge Plan :  Will you be returning to the same living situation after discharge:  Yes,  Pt returning home with parents At discharge, do you have transportation home?: Yes,  parents to pick up Do you have the ability to pay for your medications: Yes,  Pt provided with prescriptions  Release of information consent forms completed and in the chart;  Patient's signature needed at discharge.  Patient to Follow up at: Follow-up Information    [facility] Follow up on 06/15/2016.   Specialty:  Behavioral Health Why:  at 10:30am with Corie ChiquitoJessica Carter for medication management. Contact information: [address] [place] [address] [phone]        [facility] Follow up on 06/20/2016.   Why:  at 12:30pm for therapy with Melton Krebsracy Collins. Contact information: [address] [place] [address] [phone]          Next level of care provider [facility] Link:no  Safety Planning and Suicide Prevention discussed: Yes,  with parents; see SPE note  Have you used any form of tobacco in the last 30 days? (Cigarettes, Smokeless Tobacco, Cigars, and/or Pipes): Yes  Has patient been referred to the Quitline?: Patient refused referral  Patient has been referred for addiction treatment: Yes  Elaina HoopsLauren M Carter 06/13/2016, 3:51 PM

## 2016-06-13 NOTE — Progress Notes (Signed)
Recreation Therapy Notes  Animal-Assisted Activity (AAA) Program Checklist/Progress Notes Patient Eligibility Criteria Checklist & Daily Group note for Rec TxIntervention  Date: 10.03.2017 Time: 2:45pm [address]    AAA/T Program Assumption of Risk Form signed by Patient/ or Parent Legal Guardian Yes  Patient is free of allergies or sever asthma Yes  Patient reports no fear of animals Yes  Patient reports no history of cruelty to animals Yes  Patient understands his/her participation is voluntary Yes  Patient washes hands before animal contact Yes  Patient washes hands after animal contact Yes  Behavioral Response: Engaged, Attentive   Education:Hand Washing, Appropriate Animal Interaction   Education Outcome: Acknowledges education.   Clinical Observations/Feedback: Patient attended session and interacted appropriately with therapy dog and peers. Marykay Lex.   Alberta Cairns L Malikah Lakey, LRT/CTRS  Gerlene Glassburn L 06/13/2016 3:06 PM

## 2016-07-13 ENCOUNTER — Other Ambulatory Visit (HOSPITAL_COMMUNITY): Payer: BLUE CROSS/BLUE SHIELD | Attending: Psychiatry | Admitting: Licensed Clinical Social Worker

## 2016-07-13 ENCOUNTER — Encounter (INDEPENDENT_AMBULATORY_CARE_PROVIDER_SITE_OTHER): Payer: Self-pay

## 2016-07-13 DIAGNOSIS — F1010 Alcohol abuse, uncomplicated: Secondary | ICD-10-CM | POA: Diagnosis not present

## 2016-07-13 DIAGNOSIS — F332 Major depressive disorder, recurrent severe without psychotic features: Secondary | ICD-10-CM | POA: Diagnosis present

## 2016-07-13 NOTE — Progress Notes (Signed)
Pt was offered start date of tomorrow, Friday 07/14/16. Pt requested to begin treatment on Monday 07/17/16 due to organizing practical matters before beginning. Pt denies current SI/HI and contracted for safety. Pt will begin PHP on Monday 07/17/16.
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
## 2016-07-17 ENCOUNTER — Encounter (HOSPITAL_COMMUNITY): Payer: Self-pay | Admitting: Psychiatry

## 2016-07-17 ENCOUNTER — Other Ambulatory Visit (HOSPITAL_COMMUNITY): Payer: BLUE CROSS/BLUE SHIELD | Admitting: Licensed Clinical Social Worker

## 2016-07-17 DIAGNOSIS — F332 Major depressive disorder, recurrent severe without psychotic features: Secondary | ICD-10-CM | POA: Diagnosis not present

## 2016-07-17 DIAGNOSIS — F331 Major depressive disorder, recurrent, moderate: Secondary | ICD-10-CM | POA: Insufficient documentation

## 2016-07-17 MED ORDER — DULOXETINE HCL 30 MG PO CPEP: 30.0000 mg | ORAL_CAPSULE | Freq: Every day | ORAL | 1 refills | Status: DC

## 2016-07-17 NOTE — Progress Notes (Signed)
11/06 /2107  PHP Admission Note  Duration- 45 minutes   CC- Maybe I am a little better"   HPI- Patient is a 20 year old single female, known to Clinical research associatewriter from brief [facility] ( 10/[facility]-10[facility]3/17) . At the time presented for depression, following argument with her college friends/roommates , and feeling isolated and ostracized by them. She had left school and moved back in with her parents a couple of weeks prior. She reported history of depression and some neuro-vegetative symptoms of depression. She was discharged on Celexa 10 mgrs QDAY, Trazodone 50 mgrs QHS PRN for insomnia, and Vistaril PRNs for anxiety as needed . Patient states she has been living with parents at their home since her discharge from inpatient unit. States she briefly left to stay with a friend for a few days, because she felt her parents were not giving her enough personal space, but has since returned home.  States she may be feeling somewhat better but states she still feels depressed, and endorses some ongoing neuro-vegetative symptoms of depression- poor energy level,   erratic sleep, limited appetite ( but no significant weight loss) , mild to moderate anhedonia, describes some passive thoughts of death, dying, but denies any active suicidal ideations or self injurious ideations and denies any psychotic symptoms. Of note, states she has been taking Celexa as prescribed but does not feel it is helping . Reports regular cannabis use, last used a few days ago.   Past Psychiatric History- one prior psychiatric admission, as above, discharged 06/13/16. No history of suicide attempts,denies history of self cutting or self injurious behaviors, denies history of psychosis, denies history of mania or hypomania, denies history of PTSD. (+) Panic Attacks, no agoraphobia. History of prior depression, was briefly on Zoloft a few years ago.  Current Medications- Celexa 10 mgrs QDAY, Vistaril 25 mgrs Q 8 hours  PRN for anxiety as needed, Trazodone 50 mgrs QHS PRN for insomnia as needed    Substance Abuse History- denies drug or alcohol abuse , was drinking in binges and using cannabis regularly in college- states she has decreased use since she left college but has not stopped, last used 3-4 days ago .   Medical History - history of migraines, NKDA, does not smoke. Denies pregnancy    Social History- she is single, no children, currently living with her parents after she left college . As noted, reports she got into arguments with her roommates , friends, after which she felt ostracized and unwanted, which exacerbated her depression. Currently unemployed. Denies legal issues .Marland Kitchen.  Family History- parents alive, live together, one brother, states that mother and brother have  history of depression, no suicides in family  - states brother has done very well on Cymbalta trial.  ROS -  denies current  headache, denies chest pain, denies shortness of breath, no nausea, no vomiting,  Denies myalgias, arthralgias, denies rash,no fever or chills   MSE- today presents alert, attentive, fairly  groomed, good eye contact, vaguely anxious but no psychomotor agitation or restlessness, speech normal in tone, volume, rate, mood described as slightly improved but still depressed, and affect presents constricted, anxious, although she does smile at times appropriately . No thought disorder, describes some passive thoughts of death, dying but denies any active suicidal ideations , denies self injurious ideations, contracts for safety ,  denies any violent or homicidal ideations, no hallucinations, no delusions, not internally preoccupied Judgment intact, Insight fair . 0x3 .   Assessment -  20  year old female,  known to Clinical research associatewriter from   recent brief inpatient psychiatric admission in early October  this month for worsening depression. She reports history of depression, which had worsened/exacerbated after an argument with  roommates and feeling unwanted by friends, roommates in college, due to which she withdrew from college and returned home to parents . Was prescribed Celexa, and states she has been taking it without side effects, but states she does not feel it is helping . Currently presents vaguely depressed, sad, but denies any suicidal plans or  ideations .   Dx- MDD, Recurrent,Moderate to Severe, no Psychotic Features         Cannabis Dependence   Plan - PHP admission to focus on management of depression, anxiety, helping to develop coping skills and strategies to address mood  issues, decrease risk of decompensation or readmission   We discussed medication issues- as noted, reports she does not feel Celexa is helping - prefers to try another antidepressant rather than increase Celexa dose.  *Of note, states that her brother, who also has history of depression, has done very well on Cymbalta, and feels much more stable on it, and is expressing interest in Cymbalta trial.  D/C Celexa  Start Cymbalta 30 mgrs QDAY initially - # 15, one refill sent via e script- side effects and rationale discussed. Potential for increased suicidal ideations early in treatment with antidepressants  in young adults discussed.    Nehemiah MassedFernando Cobos MD

## 2016-07-18 ENCOUNTER — Encounter (HOSPITAL_COMMUNITY): Payer: Self-pay

## 2016-07-18 ENCOUNTER — Other Ambulatory Visit (HOSPITAL_COMMUNITY): Payer: BLUE CROSS/BLUE SHIELD | Admitting: Licensed Clinical Social Worker

## 2016-07-18 ENCOUNTER — Other Ambulatory Visit (HOSPITAL_COMMUNITY): Payer: BLUE CROSS/BLUE SHIELD | Admitting: Occupational Therapy

## 2016-07-18 ENCOUNTER — Encounter (HOSPITAL_COMMUNITY): Payer: Self-pay | Admitting: Occupational Therapy

## 2016-07-18 VITALS — BP 104/66 | HR 91 | Ht 66.75 in | Wt 149.8 lb

## 2016-07-18 DIAGNOSIS — F331 Major depressive disorder, recurrent, moderate: Secondary | ICD-10-CM

## 2016-07-18 DIAGNOSIS — F332 Major depressive disorder, recurrent severe without psychotic features: Secondary | ICD-10-CM | POA: Diagnosis not present

## 2016-07-18 NOTE — Progress Notes (Signed)
Patient presented with flat affect, depressed mood and stated [facility]  Stated Dr. Jama Flavorsobos changed her to Cymbalta from Celexa medication and has not started yet.  Patient to pick up this date and begin.  Patient reports suicidal ideations at times but no plan or intent to harm self.  Denies any homicidal ideations and rated her depression a 5, anxiety a 4 and hopelessness a 5 on a scale from 0-10 with 0 being none and 10 being the highest she could experience.  Patient reported liking PHP and learning skills to assist with depression and coping.  Patient currently took a medical withdrawal from [employer] and reports group is helpful with seeing other similar aged young adults experiencing similar issues.  Patient to contact this nurse if any problems with medications or worsening of symptoms.

## 2016-07-18 NOTE — Therapy (Addendum)
[facility] PARTIAL HOSPITALIZATION PROGRAM [address] [place], [place], [address] Phone: [phone]   Fax:  [phone]  Occupational Therapy Evaluation and Treatment  Patient Details  Name: Claudia Hughes MRN: [id number] Date of Birth: 07/20/1996 Referring Provider: Dr. Nehemiah Massed  Encounter Date: 07/18/2016      OT End of Session - 07/18/16 1448    Visit Number 1   Number of Visits 6   Date for OT Re-Evaluation 08/08/16   Authorization Type BCBS   OT Start Time 1030   OT Stop Time 1130   OT Time Calculation (min) 60 min   Activity Tolerance Patient tolerated treatment well   Behavior During Therapy [facility] for tasks assessed/performed      Past Medical History:  Diagnosis Date  . Acne   . Depression   . Migraines     Past Surgical History:  Procedure Laterality Date  . WISDOM TOOTH EXTRACTION      There were no vitals filed for this visit.      Subjective Assessment - 07/18/16 1447    Currently in Pain? No/denies           [facility] OT Assessment - 07/18/16 1447      Assessment   Diagnosis Depression and anxiety   Referring Provider Dr. Madaline Guthrie Cobos   Onset Date --  Chronic     Precautions   Precautions None     Balance Screen   Has the patient fallen in the past 6 months No   Has the patient had a decrease in activity level because of a fear of falling?  No   Is the patient reluctant to leave their home because of a fear of falling?  No       OT assessment Past medical history:  Hx of depression Living situation:  Lives with parents  ADLs: completes all ADL tasks for herself independently; was attending [employer] until 06/2016 Social support: Parents Struggles:  Stress management, communication skills, coping skills, feeling purposeful. Anxiety and depression.  OT goal:  To improve her daily coping skills   General Causality Orientation Scale    Subscore Percentile Score  Autonomy 56 57.22  Control 41 20.11   Impersonal 38 19.26    Motivation Type  Motivation type Explanation    Autonomy-oriented  The individual is clear about what he/she is doing. There is clear connection between behavior and interest/personal goals. Motivation is intact.         Assessment:  Patient demonstrates autonomy-oriented behaviors. There is a clear connection between behavior and interests/personal goals. Motivation is intact. Patient will benefit from occupational therapy intervention in order to improve time management, financial management, stress management, job readiness skills, social skills, sleep hygiene, exercise and healthy eating habits, and health management skills and other psychosocial skills needed for preparation to return to full time community living and to be a productive community member.     Plan:  Patient will participate in skilled occupational therapy sessions individually or in a group setting to improve coping skills, psychosocial skills, and emotional skills required to return to prior level of function as a productive community member. Treatment will be 1-2 times per week for 2-6 weeks.     OT Treatment: Job Readiness  S:  "Being dressed professionally is important." O:  Patient actively participated in the following skilled occupational therapy treatment session this date:             Job readiness-Discussed the skills necessary for  job readiness including hard and soft              Skills. Discussed the difference in each skill set, Claudia StandardAllison participated in group discussion              of what constitutes each skill and why they are important. Claudia Hughes completed the My              Ideal Job worksheet and shared findings with the group. She also completed the Draw              My Wall worksheet identifying various elements as preventing her from achieving a   job or dream. OT, Claudia Hughes, and group discussed obstacles to goal achievement including schooling, indecisiveness, and financial  barriers.  A:  Patient participated in skilled occupational therapy group for job readiness this date.  Patient was engaged and open to discussion and strategies introduced. Pt provided with The Do's and Don'ts of Keeping a Job handout and the [employer] Job Readiness Skills Outline. Claudia StandardAllison completed homework contract sheet identifying her current strength for job readiness, what she wants to improve, and a goal for achieving her future career.   P:  Continue participation in skilled occupational therapy groups  1-2 times per week for 2 weeks in order to gain the necessary skills needed to return to full time community living and learn effective coping strategies to be a productive community resident. Follow up on HEP for job readiness.           OT Short Term Goals - 07/18/16 1449      OT SHORT TERM GOAL #1   Title Patient will be educated on strategies to improve psychosocial skills needed to participate fully in all daily, work, and leisure activities.   Time 3   Period Weeks   Status New     OT SHORT TERM GOAL #2   Title Patient will be educated on a HEP and independent with implementation of HEP.   Time 3   Period Weeks   Status New     OT SHORT TERM GOAL #3   Title Patient will independently apply psychosocial skills and coping mechanisms to her daily activities in order to function independently   Time 3   Period Weeks   Status New                  Plan - 07/18/16 1448    Rehab Potential Good   OT Frequency --  1-2x/week   OT Duration --  3 weeks   OT Treatment/Interventions Self-care/ADL training  coping skills mechanism training, psychosocial skill development, community reintegration   Consulted and Agree with Plan of Care Patient      Patient will benefit from skilled therapeutic intervention in order to improve the following deficits and impairments:   (coping skills, psychosocial skills training)  Visit Diagnosis: MDD (major depressive disorder),  recurrent episode, moderate (HCC)    Problem List Patient Active Problem List   Diagnosis Date Noted  . MDD (major depressive disorder), recurrent episode, moderate (HCC) 07/17/2016  . Major depressive disorder, single episode, severe (HCC) 06/12/2016  . Suicidal ideation 06/12/2016  . 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 Migraine without aura, without mention of intractable migraine without mention of status migrainosus 03/19/2013  . Episodic tension type headache 03/19/2013  . Variants of migraine, not elsewhere classified, without mention of intractable migraine without mention of status migrainosus 03/19/2013  . Acne 03/03/2013  Ezra SitesLeslie Zunairah Devers, OTR/L  [phone] 07/18/2016, 2:50 PM  [facility] PART[facility] PROGRAM [address] [place], [place], [address] Phone: [phone]   Fax:  [phone]  Name: Gasper Lloydllison T Hughes MRN: [id number] Date of Birth: [birth year]

## 2016-07-19 ENCOUNTER — Other Ambulatory Visit (HOSPITAL_COMMUNITY): Payer: BLUE CROSS/BLUE SHIELD | Admitting: Licensed Clinical Social Worker

## 2016-07-19 DIAGNOSIS — F332 Major depressive disorder, recurrent severe without psychotic features: Secondary | ICD-10-CM

## 2016-07-19 DIAGNOSIS — F1010 Alcohol abuse, uncomplicated: Secondary | ICD-10-CM

## 2016-07-20 ENCOUNTER — Ambulatory Visit (HOSPITAL_COMMUNITY): Payer: BLUE CROSS/BLUE SHIELD | Admitting: Specialist

## 2016-07-20 ENCOUNTER — Other Ambulatory Visit (HOSPITAL_COMMUNITY): Payer: BLUE CROSS/BLUE SHIELD

## 2016-07-20 NOTE — Psych (Signed)
   [facility] PHP THERAPIST PROGRESS NOTE  Claudia Hughes [id number]  Session Time: 9 AM - 2 PM  Participation Level: Active  Behavioral Response: CasualAlertDepressed  Type of Therapy: Group Therapy  Treatment Goals addressed: Coping; Depression  Interventions: CBT, DBT, Supportive and Reframing  Summary:  Clinician facilitated check-in regarding current stressors and situation, and review of patient completed diary card. Clinician utilized active listening and empathetic response and validated patient emotions. Patient discussed progress and identified specific topics she wanted to work on for the day.  Clinician led group in mindfulness activity utilizing glitter bottles. Clinician allowed patients to discussed pertinent topics during a process session. Patient discussed communicating with the friend she was experiencing conflict with the day before. Group discussed distress tolerance skills including "STOP,"  "ACCEPTS," "TIPS" and self-soothing. Patient identified specific aspects of the skills she would utilize in her life.  Clinician assessed for immediate needs, medication compliance and efficacy, and safety concerns. Patient noted thoughts of "wanting to die" on morning sheet, and that the thoughts had decreased from the previous day. Clinician assessed for immediate danger and a plan. Patient stated she had no plan and no active thoughts of hurting herself currently. Patient stated she felt safe going home. Patient saw nurse.      Suicidal/Homicidal: Nowithout intent/plan  Therapist Response: Claudia Lloydllison T Jamil is a 20 y.o. female who presents with depression symptoms. Patient presented within time allowed and in an "OK" mood. Patient reports feeling "a little" anger, sadness, worry, and happiness, and no feelings of embarrassment over the past 24 hours.  Patient engaged in group activity and discussion. Patient provided specific ways distress tolerance skills could be helpful  for her.  Patient demonstrates continued progress as evidenced by continued participation in group and self-report of increased practice of skills learned within group. Patient denies any immediate needs.     Plan: Patient will continue in PHP and medication management. Work towards decreasing depression symptoms and increase emotional regulation and positive coping skills.   Diagnosis: Severe episode of recurrent major depressive disorder, without psychotic features (HCC) [F33.2]    1. Severe episode of recurrent major depressive disorder, without psychotic features (HCC)   2. Alcohol use disorder, mild, abuse       Claudia Hughes, [place] 07/20/2016

## 2016-07-20 NOTE — Psych (Signed)
   [facility]  Claudia Hughes [id number]  Session Time: 9 AM - 2 PM  Participation Level: Active  Behavioral Response: CasualAlertDepressed  Type of Therapy: Group Therapy  Treatment Goals addressed: Coping; Depression  Interventions: CBT, DBT, Supportive and Reframing  Summary: Clinician facilitated check-in regarding current stressors and situation, and review of patient completed diary card. Clinician utilized active listening and empathetic response and validated patient emotions. Patient introduced herself to the group and identified specific topics she wants to work on.  Clinician led group in a mindfulness activity. Clinician allowed patients to discussed pertinent topics during a process session. Patient did not want to discuss anything specific on her first day. Group discussed Positive Psychology. Clinician had clients complete a "What am I Grateful For" sheet and discussed as a group. Group watched Shaun Achor's "The Positive Advantage" Ted-Talk. Group discussed information and ideas from the Ted-Talk. Clinician led group in practicing the five activities to help lead to thinking positively discussed in the video. Patient identified exercise as the most doable for her. Clinician challenged patient to add two minutes of exercise into her daily routine.  Clinician assessed for immediate needs, medication compliance and efficacy, and safety concerns. Patient saw doctor.    Suicidal/Homicidal: Nowithout intent/plan  Therapist Response: Claudia Hughes is a 20 y.o. female who presents with depression symptoms. Patient presented within time allowed and in an "OK" mood. Patient rated herself at a "4" out of 10 at the beginning of group due to conflict with her parents and running late. Patient rated herself at a "6" at the end of group, stating she feels hopeful after being in group. Patient reports feeling "a little" sadness, worry, and anger, and no  feelings of embarrassment or happiness over the past 24 hours.  Patient engaged in group activity and discussion. Patient provided specific ways positive psychology, and thinking positively, could help her.  Patient has not demonstrated progress yet due to today being her first day in group. Patient denies any immediate needs.    Plan: Patient will continue in PHP and medication management. Work towards decreasing depression symptoms and increase emotional regulation and positive coping skills.   Diagnosis: MDD (major depressive disorder), recurrent episode, moderate (HCC) [F33.1]    1. MDD (major depressive disorder), recurrent episode, moderate (HCC)       Donia GuilesJenny Ashwath Lasch, LCSW 07/20/2016

## 2016-07-20 NOTE — Psych (Signed)
   [facility]  Claudia Hughes [id number]  Session Time: 9 AM - 2 PM  Participation Level: Active  Behavioral Response: CasualAlertDepressed  Type of Therapy: Group Therapy  Treatment Goals addressed: Coping; Depression  Interventions: CBT, DBT, Supportive and Reframing  Summary: Clinician facilitated check-in regarding current stressors and situation, and review of patient completed diary card. Clinician utilized active listening and empathetic response and validated patient emotions. Patient discussed progress and identified specific topics she wanted to work on for the day.  Clinician allowed patients to discussed pertinent topics during a process session. Patient discussed conflict with parents. Clinician and group encouraged her to work on communication with parents and use some radical acceptance. Group discussed job skills with occupational therapist. Patient stated it was "helpful" and provided good information for what she needs to work on now to reach her job goals. Group discussed mindfulness. Daron Larsen's "Don't try to be mindful," TedTalk was utilized. Group discussed the "What" and "How" skills of being mindful.  Clinician assessed for immediate needs, medication compliance and efficacy, and safety concerns. Patient noted thoughts of "wanting to die" on morning sheet. Clinician assessed for immediate danger and a plan. Patient stated the thoughts are currently "typical" for her. Patient stated she had no plan and no active thoughts of hurting herself currently. Patient stated she felt safe going home. Patient saw nurse.     Suicidal/Homicidal: Nowithout intent/plan  Therapist Response: Claudia Hughes is a 20 y.o. female who presents with depression symptoms. Patient presented within time allowed and in an "irritable" mood. Patient rated herself at a "6" out of 10 at the beginning of group due to conflict with her parents. Patient rated herself  at a "7" at the end of group, stating she feels hopeful after being in group. Patient reports feeling "a lot" of anger, "a little" sadness, worry, and happiness, and no feelings of embarrassment over the past 24 hours.  Patient engaged in group activity and discussion. Patient provided specific ways mindfulness could be helpful for her.  Patient demonstrates continued progress as evidenced by continued participation in group and self-report of increased practice of skills learned within group. Patient denies any immediate needs.     Plan: Patient will continue in PHP and medication management. Work towards decreasing depression symptoms and increase emotional regulation and positive coping skills.   Diagnosis: MDD (major depressive disorder), recurrent episode, moderate (HCC) [F33.1]    1. MDD (major depressive disorder), recurrent episode, moderate (HCC)       Donia GuilesJenny Airel Magadan, LCSW 07/20/2016

## 2016-07-21 ENCOUNTER — Other Ambulatory Visit (HOSPITAL_COMMUNITY): Payer: BLUE CROSS/BLUE SHIELD | Admitting: Licensed Clinical Social Worker

## 2016-07-21 DIAGNOSIS — F332 Major depressive disorder, recurrent severe without psychotic features: Secondary | ICD-10-CM | POA: Diagnosis not present

## 2016-07-21 NOTE — Psych (Signed)
   [facility]  Claudia Hughes [id number]  Session Time: 9 AM - 1 PM  Participation Level: Active  Behavioral Response: CasualAlertDepressed  Type of Therapy: Group Therapy  Treatment Goals addressed: Coping; Depression  Interventions: CBT, DBT, Supportive and Reframing  Summary:  Clinician facilitated check-in regarding current stressors and situation, and review of patient completed diary card. Clinician utilized active listening and empathetic response and validated patient emotions. Clinician facilitated discussion on managing anxiety, family issues, and future goals. Clinician reviewed topic of mindfulness and how it can be used to manage symptoms. Clinician led mindfulness workshop in which group members practiced mindfulness activities utilizing different senses. Clinician assessed for immediate needs, medication compliance and efficacy, and safety concerns.    Suicidal/Homicidal: Nowithout intent/plan  Therapist Response: Claudia Hughes is a 20 y.o. female who presents with depression symptoms. Patient presented within time allowed and in a "okay" mood. Patient reports feeling "down" yesterday and unable to get out opf bed. Patient engaged in group activity and discussion. Patient identified thoughtful forms of mindfulness as a way she can utilize the skill. Patient demonstrates continued progress as evidenced by  participation in group and increased openness. Patient denies SI/HI and immediate needs.    Plan: Patient will continue in PHP and medication management. Work towards decreasing depression symptoms and increase emotional regulation and positive coping skills.   Diagnosis: Severe episode of recurrent major depressive disorder, without psychotic features (HCC) [F33.2]    1. Severe episode of recurrent major depressive disorder, without psychotic features (HCC)       Donia GuilesJenny Kinzleigh Kandler, LCSW 07/21/2016

## 2016-07-24 ENCOUNTER — Other Ambulatory Visit (HOSPITAL_COMMUNITY): Payer: BLUE CROSS/BLUE SHIELD | Admitting: Licensed Clinical Social Worker

## 2016-07-24 DIAGNOSIS — F332 Major depressive disorder, recurrent severe without psychotic features: Secondary | ICD-10-CM

## 2016-07-24 DIAGNOSIS — F1010 Alcohol abuse, uncomplicated: Secondary | ICD-10-CM

## 2016-07-25 ENCOUNTER — Encounter (HOSPITAL_COMMUNITY): Payer: Self-pay | Admitting: Occupational Therapy

## 2016-07-25 ENCOUNTER — Other Ambulatory Visit (HOSPITAL_COMMUNITY): Payer: BLUE CROSS/BLUE SHIELD | Admitting: Occupational Therapy

## 2016-07-25 ENCOUNTER — Other Ambulatory Visit (HOSPITAL_COMMUNITY): Payer: BLUE CROSS/BLUE SHIELD | Admitting: Licensed Clinical Social Worker

## 2016-07-25 DIAGNOSIS — F332 Major depressive disorder, recurrent severe without psychotic features: Secondary | ICD-10-CM

## 2016-07-25 DIAGNOSIS — F1010 Alcohol abuse, uncomplicated: Secondary | ICD-10-CM

## 2016-07-25 NOTE — Progress Notes (Signed)
11/13 /2107  PHP Progress Note  Duration- 20  minutes   Subjective-  patient reports she is feeling better, and describes partially but significantly improved mood and less anxiety symptoms.  She feels she is making progress and having better tools and resources to address anxiety, negative affects. Denies medication side effects. Denies any suicidal ideations.   Objective - Reports improving mood as well as improving anxiety. Denies suicidal ideations. Presents better groomed, with improved eye contact, and with a less anxious presentation/demeanor. She is less ruminative about negative experiences in college, particularly feeling betrayed and ostracized by her roommates, and presents more future oriented . Denies medication side effects- recently switched from Celexa to Cymbalta, which she states has been very effective for her brother, who also has history of mood disorder . Thus far tolerating Cymbalta trial well .   Current Medications- Cymbalta  ( 30 mgrs QDAY) , Vistaril (  25 mgr Q 8 hours PRN for anxiety), Trazodone ( 50 mgrs QHS PRN for insomnia- not taking consistently)    ROS - at this time denies headache,no  nausea, no vomiting, no rash, no fever, no chills   MSE- Presents  alert, attentive, well groomed, good eye contact, presents calmer, less anxious , no psychomotor agitation or retardation, speech normal ,mood partially improved, denies significant depression at this time, affect is less anxious and fuller in range .No thought disorder, denies suicidal ideations , denies self injurious ideations, denies homicidal ideations, no hallucinations, no delusions, not internally preoccupied .Judgment and Insight improved . 0x3 .  Assessment - patient reports improving mood and decreased anxiety. Presents with improved range of affect. Denies any suicidal ideations . She is tolerating Cymbalta trial well thus far, denies side effects.  She reports she is benefiting from  [facility] , coping skills she is learning .   Dx-  MDD without psychotic features, Cannabis Abuse   Plan- continue PHP participation . Continue medications as above - no medication changes at this time Continue to encourage sobriety/abstinence from cannabis as part of treatment goals .  Nehemiah MassedFernando Cobos, MD

## 2016-07-25 NOTE — Psych (Signed)
   [facility]  Claudia Lloydllison T Charley [id number]  Session Time: 9 AM - 2 PM  Participation Level: Active  Behavioral Response: CasualAlertDepressed  Type of Therapy: Group Therapy  Treatment Goals addressed: Coping; Depression  Interventions: CBT, DBT, Supportive and Reframing  Summary:  Clinician facilitated check-in regarding current stressors and situation, and review of patient completed diary card. Clinician utilized active listening and empathetic response and validated patient emotions. Clinician facilitated discussion on how to utilize skills discussed and events over the weekend. Clinician introduced topic of grounding and utilized handout "Detaching from Emotional Pain" to educate on types of grounding and facilitate conversation.  Clinician assessed for immediate needs, medication compliance and efficacy, and safety concerns.    Suicidal/Homicidal: Nowithout intent/plan  Therapist Response: Claudia Hughes is a 20 y.o. female who presents with depression symptoms. Patient presented within time allowed and in a "good" mood. Patient rates her mood as a 6 on a 1-10 scale with 10 being "great." Patient engaged in group activity and discussion. Patient identified ways in which she can utilize grounding. Patient demonstrates continued progress as evidenced by  participation in group and exhibiting understanding of topics. Patient denies SI/HI and immediate needs.    Plan: Patient will continue in PHP and medication management. Work towards decreasing depression symptoms and increase emotional regulation and positive coping skills.   Diagnosis: Severe episode of recurrent major depressive disorder, without psychotic features (HCC) [F33.2]    1. Severe episode of recurrent major depressive disorder, without psychotic features (HCC)   2. Alcohol use disorder, mild, abuse       Donia GuilesJenny Tarun Patchell, [place]CS[place] 07/25/2016

## 2016-07-25 NOTE — Therapy (Signed)
[facility] [address] [place], [place], [address] Phone: [phone]   Fax:  [phone]  Occupational Therapy Treatment  Patient Details  Name: Claudia Hughes MRN: [id number] Date of Birth: 15-Nov-1995 Referring Provider: Dr. Nehemiah MassedFernando Cobos  Encounter Date: 07/25/2016      OT End of Session - 07/25/16 1608    Visit Number 2   Number of Visits 6   Date for OT Re-Evaluation 08/08/16   Authorization Type [employer]   OT Start Time 1030   OT Stop Time 1132   OT Time Calculation (min) 62 min   Activity Tolerance Patient tolerated treatment well   Behavior During Therapy [facility] for tasks assessed/performed      Past Medical History:  Diagnosis Date  . Acne   . Depression   . Migraines     Past Surgical History:  Procedure Laterality Date  . WISDOM TOOTH EXTRACTION      There were no vitals filed for this visit.      Subjective Assessment - 07/25/16 1608    Currently in Pain? No/denies            [facility] - 07/25/16 1607      Assessment   Diagnosis Depression and anxiety     Precautions   Precautions None        OT Group: Financial Management   S:  "I've written checks occasionally." O:  Patient actively participated in the following skilled occupational therapy treatment session this date:             Financial management-Discussed the importance of good financial management in all              areas of life. Claudia Hughes participated in group discussion regarding current methods of              budgeting and if these are effective or ineffective. She participated in identifying the              "big five" necessities for financial living (food, shelter, insurance, taxes, and              Transportation). Discussed the top ten reasons for not saving, as well as what a money pit is. Claudia Hughes identified her personal money pit as vaping. She participated in group discussion on ways to save and  how to develop a budget and spending plan. OT and Claudia Hughes brainstormed financial management techniques to employ including keeping a spending journal, budgeting monthly or weekly, and developing a savings plan.  A:  Patient participated in skilled occupational therapy group for financial management skills this date.  Patient was engaged and open to strategies introduced. Pt provided with spending journal to record spending habits, Air cabin crewinancial Rules for 20 MicrosoftSomethings handout, and a Engineer, building servicesbudget worksheet for college students. Claudia Hughes completed homework contract sheet identifying her current budgeting concerns, what she wants to improve, and a goal for budgeting/saving.  P:  Continue participation in skilled occupational therapy groups  1-2 times per week for 2 weeks in order to gain the necessary skills needed to return to full time community living and learn effective coping strategies to be a productive community resident. Follow up on HEP for developing a budget or savings plan.           OT Short Term Goals - 07/25/16 1609      OT SHORT TERM GOAL #1   Title Patient will be educated on strategies to improve psychosocial skills  needed to participate fully in all daily, work, and leisure activities.   Time 3   Period Weeks   Status On-going     OT SHORT TERM GOAL #2   Title Patient will be educated on a HEP and independent with implementation of HEP.   Time 3   Period Weeks   Status On-going     OT SHORT TERM GOAL #3   Title Patient will independently apply psychosocial skills and coping mechanisms to her daily activities in order to function independently   Time 3   Period Weeks   Status On-going                  Plan - 07/25/16 1608    Rehab Potential Good   OT Frequency --  1-2x/week   OT Duration --  3 weeks   OT Treatment/Interventions Self-care/ADL training  coping skills mechanism training, psychosocial skills development, community reintegration   Consulted and  Agree with Plan of Care Patient      Patient will benefit from skilled therapeutic intervention in order to improve the following deficits and impairments:   (coping skills training, psychosocial skill development)  Visit Diagnosis: Severe episode of recurrent major depressive disorder, without psychotic features (HCC)    Problem List Patient Active Problem List   Diagnosis Date Noted  . MDD (major depressive disorder), recurrent episode, moderate (HCC) 07/17/2016  . Major depressive disorder, single episode, severe (HCC) 06/12/2016  . Suicidal ideation 06/12/2016  . 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 Migraine without aura, without mention of intractable migraine without mention of status migrainosus 03/19/2013  . Episodic tension type headache 03/19/2013  . Variants of migraine, not elsewhere classified, without mention of intractable migraine without mention of status migrainosus 03/19/2013  . Acne 03/03/2013   Ezra SitesLeslie Kerianna Rawlinson, OTR/L  [phone] 07/25/2016, 4:10 PM  [facility] [address] [place], [place], [address] Phone: [phone]   Fax:  [phone]  Name: Claudia Hughes MRN: [id number] Date of Birth: 12/21/1995

## 2016-07-26 ENCOUNTER — Other Ambulatory Visit (HOSPITAL_COMMUNITY): Payer: BLUE CROSS/BLUE SHIELD

## 2016-07-26 NOTE — Psych (Signed)
   [facility]  Claudia Hughes [id number]  Session Time: 9 AM - 2 PM  Participation Level: Active  Behavioral Response: CasualAlertDepressed  Type of Therapy: Group Therapy  Treatment Goals addressed: Coping; Depression  Interventions: CBT, DBT, Supportive and Reframing  Summary:  Clinician facilitated check-in regarding current stressors and situation, and review of patient completed diary card. Clinician utilized active listening and empathetic response and validated patient emotions. Group discussed financial planning and management with occupational therapist. Group discussed difference between feelings and emotions. Group played "feelings [name]" where each member had to explain the emotion drawn and describe a time she felt that way. The game brought about good discussion related to feelings and how everyone defines and experiences them. Clinician assessed for immediate needs, medication compliance and efficacy, and safety concerns.    Suicidal/Homicidal: Nowithout intent/plan  Therapist Response: Claudia Lloydllison T Kraft is a 20 y.o. female who presents with depression symptoms. Patient presented within time allowed and in an "okay" mood. Patient rates her mood as a 6 on a 1-10 scale with 10 being "great." Patient engaged in group activity and discussion. Patient provided specific instances she experienced specific emotions and provided explanations of emotions drawn during game. Patient demonstrates continued progress as evidenced by continued participation in group and self-report of increased practice of skills learned within group. Patient denies SI/HI and immediate needs.    Plan: Patient will continue in PHP and medication management. Work towards decreasing depression symptoms and increase emotional regulation and positive coping skills.   Diagnosis: Severe episode of recurrent major depressive disorder, without psychotic features (HCC) [F33.2]    1.  Severe episode of recurrent major depressive disorder, without psychotic features (HCC)   2. Alcohol use disorder, mild, abuse       Donia GuilesJenny Lysette Lindenbaum, [place] 07/26/2016

## 2016-07-27 ENCOUNTER — Other Ambulatory Visit (HOSPITAL_COMMUNITY): Payer: BLUE CROSS/BLUE SHIELD | Admitting: Licensed Clinical Social Worker

## 2016-07-27 ENCOUNTER — Other Ambulatory Visit (HOSPITAL_COMMUNITY): Payer: BLUE CROSS/BLUE SHIELD | Admitting: Specialist

## 2016-07-27 DIAGNOSIS — F332 Major depressive disorder, recurrent severe without psychotic features: Secondary | ICD-10-CM | POA: Diagnosis not present

## 2016-07-27 NOTE — Therapy (Signed)
[facility] [address] [place], [place], [address] Phone: [phone]   Fax:  [phone] 79[phone]  Occupational Therapy Treatment  Patient Details  Name: Claudia Hughes MRN: [id number] Date of Birth: 1995-11-10 Referring Provider: Dr. Nehemiah MassedFernando Cobos  Encounter Date: 07/27/2016      OT End of Session - 07/27/16 1524    Visit Number 3   Date for OT Re-Evaluation 08/08/16   Authorization Type [employer]   OT Start Time 1030   OT Stop Time 1130   OT Time Calculation (min) 60 min   Activity Tolerance Patient tolerated treatment well   Behavior [facility] for tasks assessed/performed      Past Medical History:  Diagnosis Date  . Acne   . Depression   . Migraines     Past Surgical History:  Procedure Laterality Date  . WISDOM TOOTH EXTRACTION      There were no vitals filed for this visit.      Subjective Assessment - 07/27/16 1524    Currently in Pain? No/denies   Pain Score 0-No pain          S:  I like to schedule differently for school vs home stuff. O:  Patient actively participated in the following skilled occupational therapy group this date: o Time management - pros and cons of effective time management, efficient time management, scheduling strategies. Patient remained focused and engaged in group. A:  Patient participated in skilled occupational therapy group for time management skills this date.  Patient was engaged. P:  Continue participation in skilled occupational therapy groups  1-2 times per week for 2weeks in order to gain the necessary skills needed to return to full time community living and learn effective coping strategies to be a productive community resident.                        OT Education - 07/27/16 1524    Education provided Yes   Education Details time Public house managermanagement skills   Person(s) Educated Patient   Methods Explanation   Comprehension Verbalized  understanding          OT Short Term Goals - 07/25/16 1609      OT SHORT TERM GOAL #1   Title Patient will be educated on strategies to improve psychosocial skills needed to participate fully in all daily, work, and leisure activities.   Time 3   Period Weeks   Status On-going     OT SHORT TERM GOAL #2   Title Patient will be educated on a HEP and independent with implementation of HEP.   Time 3   Period Weeks   Status On-going     OT SHORT TERM GOAL #3   Title Patient will independently apply psychosocial skills and coping mechanisms to her daily activities in order to function independently   Time 3   Period Weeks   Status On-going                Patient will benefit from skilled therapeutic intervention in order to improve the following deficits and impairments:    coping skills, pyschosocial skills. Visit Diagnosis: Severe episode of recurrent major depressive disorder, without psychotic features [facility](HCC)    Problem List Patient Active Problem List   Diagnosis Date Noted  . MDD (major depressive disorder), recurrent episode, moderate (HCC) 07/17/2016  . Major depressive disorder, single episode, severe (HCC) 06/12/2016  . Suicidal ideation 06/12/2016  . 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 Migraine without aura, without mention of intractable migraine without mention of status migrainosus 03/19/2013  . Episodic tension type headache 03/19/2013  . Variants of migraine, not elsewhere classified, without mention of intractable migraine without mention of status migrainosus 03/19/2013  . Acne 03/03/2013    Shirlean MylarBethany H. Kendal Ghazarian, MHA, OTR/L [phone]  07/27/2016, 3:25 PM  [facility] [address] [place], [place], [address] Phone: [phone]   Fax:  [phone]  Name: Claudia Hughes MRN: [id number] Date of Birth: 11-May-1996

## 2016-07-27 NOTE — Psych (Signed)
   [facility]  Claudia Hughes Pile [id number]  Session Time: 9 AM - 1 PM  Participation Level: Active  Behavioral Response: CasualAlertDepressed  Type of Therapy: Group Therapy  Treatment Goals addressed: Coping; Depression  Interventions: CBT, DBT, Supportive and Reframing  Summary:  Clinician facilitated check-in regarding current stressors and situation, and review of patient completed diary card. Clinician utilized active listening and empathetic response and validated patient emotions. Clinician facilitated discussion on offering ourselves grace, honoring  feelings, and finding joy. Clinician continued topic of cognitive distortions. Clinician introduced topic of happiness and what it entails. Clinician assessed for immediate needs, medication compliance and efficacy, and safety concerns.    Suicidal/Homicidal: Nowithout intent/plan  Therapist Response: Claudia Hughes Masterson is a 20 y.o. female who presents with depression symptoms. Patient presented within time allowed and in a "good" mood. Patient rates her mood as a 7 on a 1-10 scale with 10 being "great." Patient engaged in group activity and discussion. Patient states she was not in group yesterday due to not wanting to get out of bed. Patient demonstrates some progress as evidenced by continued participation in group and understanding of topics, however continues to struggle utilizing what is l;earned outside of group. . Patient denies SI/HI and immediate needs.    Plan: Patient will continue in PHP and medication management. Work towards decreasing depression symptoms and increase emotional regulation and positive coping skills.   Diagnosis: Severe episode of recurrent major depressive disorder, without psychotic features (HCC) [F33.2]    1. Severe episode of recurrent major depressive disorder, without psychotic features (HCC)       Donia GuilesJenny Javius Sylla, LCSW 07/27/2016

## 2016-07-28 ENCOUNTER — Other Ambulatory Visit (HOSPITAL_COMMUNITY): Payer: BLUE CROSS/BLUE SHIELD

## 2016-07-31 ENCOUNTER — Other Ambulatory Visit (HOSPITAL_COMMUNITY): Payer: BLUE CROSS/BLUE SHIELD | Admitting: Licensed Clinical Social Worker

## 2016-07-31 DIAGNOSIS — F332 Major depressive disorder, recurrent severe without psychotic features: Secondary | ICD-10-CM | POA: Diagnosis not present

## 2016-08-01 ENCOUNTER — Ambulatory Visit (HOSPITAL_COMMUNITY): Payer: BLUE CROSS/BLUE SHIELD

## 2016-08-01 ENCOUNTER — Other Ambulatory Visit (HOSPITAL_COMMUNITY): Payer: BLUE CROSS/BLUE SHIELD

## 2016-08-02 ENCOUNTER — Other Ambulatory Visit (HOSPITAL_COMMUNITY): Payer: BLUE CROSS/BLUE SHIELD | Admitting: Licensed Clinical Social Worker

## 2016-08-02 DIAGNOSIS — F332 Major depressive disorder, recurrent severe without psychotic features: Secondary | ICD-10-CM

## 2016-08-02 NOTE — Psych (Signed)
   [facility]  Claudia Hughes [id number]  Session Time: 9 AM - 2 PM  Participation Level: Active  Behavioral Response: CasualAlertDepressed  Type of Therapy: Group Therapy  Treatment Goals addressed: Coping; Depression  Interventions: CBT, DBT, Supportive and Reframing  Summary:  Clinician facilitated check-in regarding current stressors and situation, and review of patient completed diary card. Clinician utilized active listening and empathetic response and validated patient emotions. Clinician facilitated check-in regarding current stressors and situation, and review of patient completed diary card. Clinician utilized active listening and empathetic response and validated patient emotions. Clinician facilitated discussion on hobbies, relationship stress, and coping skills. Clinician introduced topic of healthy relationships. Clinician led activity to determine ideal traits in a relationship and to differentiate healthy/unhealthy relationship characteristics. Clinician assessed for immediate needs, medication compliance and efficacy, and safety concerns.    Suicidal/Homicidal: Nowithout intent/plan  Therapist Response: Claudia Hughes is a 20 y.o. female who presents with depression symptoms. Patient presented within time allowed and in a "good" mood. Patient rates her mood as a 8 on a 1-10 scale with 10 being "great." Patient engaged in group activity and discussion. Patient identified what she wants in an ideal relationship and was able to accurately recognize unhealthy relationship traits. Patient demonstrates some progress as evidenced by continued participation in group and understanding of topics, however continues to have sporadic attendance.  Patient denies SI/HI and immediate needs.    Plan: Patient will continue in PHP and medication management. Work towards decreasing depression symptoms and increase emotional regulation and positive coping skills.    Diagnosis: Severe episode of recurrent major depressive disorder, without psychotic features (HCC) [F33.2]    1. Severe episode of recurrent major depressive disorder, without psychotic features (HCC)       Donia GuilesJenny Rainey Kahrs, LCSW 08/02/2016

## 2016-08-02 NOTE — Psych (Signed)
   [facility] PHP THERAPIST PROGRESS NOTE  Claudia Hughes Demauro [id number]  Session Time: 9 AM - 2 PM  Participation Level: Active  Behavioral Response: CasualAlertDepressed  Type of Therapy: Group Therapy  Treatment Goals addressed: Coping; Depression  Interventions: CBT, DBT, Supportive and Reframing  Summary:  Clinician facilitated check-in regarding current stressors and situation, and review of patient completed diary card. Clinician utilized active listening and empathetic response and validated patient emotions. Clinician facilitated discussion on motivation and goal setting. Clinician introduced topic positive psychology and ways to "re-train" your brain for positivity. Clinician led gratitude activity and group participated in coping skill bingo.  Clinician assessed for immediate needs, medication compliance and efficacy, and safety concerns.    Suicidal/Homicidal: Nowithout intent/plan  [name] is a 20 y.o. female who presents with depression symptoms. Patient presented within time allowed and in a "good" mood. Patient rates her mood as a 7 on a 1-10 scale with 10 being "great." Patient engaged in group activity and discussion.  Patient was able to share one baby step to take towards a goal. Patient  reports understanding of topic and named gratitudes and coping skills to utilize on holiday break.  Patient demonstrates some progress as evidenced by openness and constructive additions to discussion. Patient denies SI/HI and immediate needs.    Plan: Patient will continue in PHP and medication management. Work towards decreasing depression symptoms and increase emotional regulation and positive coping skills.   Diagnosis: Severe episode of recurrent major depressive disorder, without psychotic features (HCC) [F33.2]    1. Severe episode of recurrent major depressive disorder, without psychotic features (HCC)       Donia GuilesJenny Birdena Kingma, LCSW 08/02/2016

## 2016-08-07 ENCOUNTER — Other Ambulatory Visit (HOSPITAL_COMMUNITY): Payer: BLUE CROSS/BLUE SHIELD

## 2016-08-08 ENCOUNTER — Ambulatory Visit (HOSPITAL_COMMUNITY): Payer: BLUE CROSS/BLUE SHIELD

## 2016-08-08 ENCOUNTER — Other Ambulatory Visit (HOSPITAL_COMMUNITY): Payer: BLUE CROSS/BLUE SHIELD

## 2016-08-09 ENCOUNTER — Other Ambulatory Visit (HOSPITAL_COMMUNITY): Payer: BLUE CROSS/BLUE SHIELD

## 2016-08-10 ENCOUNTER — Ambulatory Visit (HOSPITAL_COMMUNITY): Payer: BLUE CROSS/BLUE SHIELD

## 2016-08-10 ENCOUNTER — Other Ambulatory Visit (HOSPITAL_COMMUNITY): Payer: BLUE CROSS/BLUE SHIELD | Admitting: Psychiatry

## 2016-08-10 NOTE — Progress Notes (Deleted)
Claudia Hughes was admitted to Partial hospital program on 07/17/2016 and is being discharged on 08/10/2016 due to non attendance.  She has missed the last 4 days without returning calls and was very sporadic in attendance prior to that.  She was not suicidal during her stay in the program Final diagnosis was major depression, recurrent moderate No medication changes Follow up will be arranged over the phone

## 2016-08-11 ENCOUNTER — Other Ambulatory Visit (HOSPITAL_COMMUNITY): Payer: BLUE CROSS/BLUE SHIELD | Attending: Psychiatry | Admitting: Licensed Clinical Social Worker

## 2016-08-11 DIAGNOSIS — F332 Major depressive disorder, recurrent severe without psychotic features: Secondary | ICD-10-CM

## 2016-08-14 ENCOUNTER — Other Ambulatory Visit (HOSPITAL_COMMUNITY): Payer: BLUE CROSS/BLUE SHIELD

## 2016-08-14 NOTE — Psych (Signed)
   [facility] BH PHP THERAPIST PROGRESS NOTE  Claudia Hughes [id number]  Session Time: 9 AM - 1 PM  Participation Level: Active  Behavioral Response: CasualAlertEuthymic  Type of Therapy: Group Therapy  Treatment Goals addressed: Coping; Depression  Interventions: CBT, DBT, Supportive and Reframing  Summary:  Clinician facilitated check-in regarding current stressors and situation, and review of patient completed diary card. Clinician utilized active listening and empathetic response and validated patient emotions. Clinician facilitated discussion on mindfulness, joy, and emotional reactions. Clinician reviewed topic of self soothe. Clinician led art therapy activity on visual self soothing. Group viewed TED talk regarding multipotentialites and discussed how perspective can shape our reality. Clinician assessed for immediate needs, medication compliance and efficacy, and safety concerns.  Clinician met with patient to review progress and discharge plan. Patient was scheduled to discharge on 08/08/16 however did not attend.   Suicidal/Homicidal: Nowithout intent/plan  Therapist Response: [name] is a 20 y.o. female who presents with depression symptoms. Patient presented within time allowed and in a "good" mood. Patient rates her mood as a 6 on a 1-10 scale with 10 being "great." Patient states she has been sick and then had car trouble which caused her distress and that is why she has not been in group. Patient engaged in group activity and discussion.  Patient participated in art therapy activity. Patient was able to identify skills that can be transferred from one area of life to another.  Patient demonstrates some progress as evidenced by openness with group and some insight. Patient denies SI/HI and immediate needs.    Plan: Patient will discharge from [place]. Patient's case was discussed with psychiatrist, who approved discharge due to patient achieving increased stability and due  to limited engagement in program. Patient reports decrease in regular depressive and anxiety symptoms and having "more good than bad days." Patient denies SI/HI. Patient requested to return to her regular outpatient therapist and psychiatrist. Patient has an appointment with her therapist, [name] at [facility], on 08/24/16 and will follow up with her psychiatrist to schedule next appointment.   Diagnosis: Severe episode of recurrent major depressive disorder, without psychotic features ([place]) [F33.2]    1. Severe episode of recurrent major depressive disorder, without psychotic features ([place])       Lorin Glass, LCSW 08/14/2016

## 2016-08-15 ENCOUNTER — Other Ambulatory Visit (HOSPITAL_COMMUNITY): Payer: BLUE CROSS/BLUE SHIELD

## 2016-08-16 ENCOUNTER — Other Ambulatory Visit (HOSPITAL_COMMUNITY): Payer: BLUE CROSS/BLUE SHIELD

## 2016-08-17 ENCOUNTER — Other Ambulatory Visit (HOSPITAL_COMMUNITY): Payer: BLUE CROSS/BLUE SHIELD

## 2016-08-18 ENCOUNTER — Other Ambulatory Visit (HOSPITAL_COMMUNITY): Payer: BLUE CROSS/BLUE SHIELD

## 2016-08-21 ENCOUNTER — Other Ambulatory Visit (HOSPITAL_COMMUNITY): Payer: BLUE CROSS/BLUE SHIELD

## 2016-08-22 ENCOUNTER — Other Ambulatory Visit (HOSPITAL_COMMUNITY): Payer: BLUE CROSS/BLUE SHIELD

## 2016-08-23 ENCOUNTER — Other Ambulatory Visit (HOSPITAL_COMMUNITY): Payer: BLUE CROSS/BLUE SHIELD

## 2016-08-24 ENCOUNTER — Other Ambulatory Visit (HOSPITAL_COMMUNITY): Payer: BLUE CROSS/BLUE SHIELD

## 2016-08-25 ENCOUNTER — Other Ambulatory Visit (HOSPITAL_COMMUNITY): Payer: BLUE CROSS/BLUE SHIELD

## 2016-08-28 ENCOUNTER — Other Ambulatory Visit (HOSPITAL_COMMUNITY): Payer: BLUE CROSS/BLUE SHIELD

## 2017-07-25 ENCOUNTER — Emergency Department (HOSPITAL_BASED_OUTPATIENT_CLINIC_OR_DEPARTMENT_OTHER): Payer: BLUE CROSS/BLUE SHIELD

## 2017-07-25 ENCOUNTER — Emergency Department (HOSPITAL_BASED_OUTPATIENT_CLINIC_OR_DEPARTMENT_OTHER)
Admission: EM | Admit: 2017-07-25 | Discharge: 2017-07-25 | Disposition: A | Discharge status: Home / Self Care | Payer: BLUE CROSS/BLUE SHIELD | Attending: Emergency Medicine | Admitting: Emergency Medicine

## 2017-07-25 ENCOUNTER — Encounter (HOSPITAL_BASED_OUTPATIENT_CLINIC_OR_DEPARTMENT_OTHER): Payer: Self-pay

## 2017-07-25 DIAGNOSIS — Z87891 Personal history of nicotine dependence: Secondary | ICD-10-CM | POA: Diagnosis not present

## 2017-07-25 DIAGNOSIS — J36 Peritonsillar abscess: Secondary | ICD-10-CM

## 2017-07-25 DIAGNOSIS — J029 Acute pharyngitis, unspecified: Secondary | ICD-10-CM | POA: Diagnosis present

## 2017-07-25 LAB — CBC WITH DIFFERENTIAL/PLATELET
BASOS ABS: 0 10*3/uL (ref 0.0–0.1)
BASOS PCT: 0 %
EOS PCT: 0 %
Eosinophils Absolute: 0.1 10*3/uL (ref 0.0–0.7)
HEMATOCRIT: 39.2 % (ref 36.0–46.0)
Hemoglobin: 13.1 g/dL (ref 12.0–15.0)
LYMPHS PCT: 7 %
Lymphs Abs: 1.6 10*3/uL (ref 0.7–4.0)
MCH: 28.9 pg (ref 26.0–34.0)
MCHC: 33.4 g/dL (ref 30.0–36.0)
MCV: 86.3 fL (ref 78.0–100.0)
MONO ABS: 1.5 10*3/uL — AB (ref 0.1–1.0)
Monocytes Relative: 6 %
NEUTROS ABS: 20.8 10*3/uL — AB (ref 1.7–7.7)
Neutrophils Relative %: 87 %
PLATELETS: 276 10*3/uL (ref 150–400)
RBC: 4.54 MIL/uL (ref 3.87–5.11)
RDW: 14.8 % (ref 11.5–15.5)
WBC: 24 10*3/uL — AB (ref 4.0–10.5)

## 2017-07-25 LAB — BASIC METABOLIC PANEL
ANION GAP: 10 (ref 5–15)
BUN: 9 mg/dL (ref 6–20)
CALCIUM: 9.7 mg/dL (ref 8.9–10.3)
CO2: 23 mmol/L (ref 22–32)
Chloride: 103 mmol/L (ref 101–111)
Creatinine, Ser: 0.67 mg/dL (ref 0.44–1.00)
GLUCOSE: 129 mg/dL — AB (ref 65–99)
POTASSIUM: 3.8 mmol/L (ref 3.5–5.1)
SODIUM: 136 mmol/L (ref 135–145)

## 2017-07-25 LAB — RAPID STREP SCREEN (MED CTR MEBANE ONLY): STREPTOCOCCUS, GROUP A SCREEN (DIRECT): NEGATIVE

## 2017-07-25 LAB — HCG, SERUM, QUALITATIVE: PREG SERUM: NEGATIVE

## 2017-07-25 LAB — MONONUCLEOSIS SCREEN: Mono Screen: NEGATIVE

## 2017-07-25 MED ORDER — PREDNISONE 10 MG PO TABS: 20.0000 mg | ORAL_TABLET | Freq: Two times a day (BID) | ORAL | 0 refills | Status: AC

## 2017-07-25 MED ORDER — IOPAMIDOL (ISOVUE-300) INJECTION 61%
100.0000 mL | Freq: Once | INTRAVENOUS | Status: AC | PRN
  Administered 2017-07-25: 75 mL via INTRAVENOUS

## 2017-07-25 MED ORDER — DEXAMETHASONE SODIUM PHOSPHATE 10 MG/ML IJ SOLN
10.0000 mg | Freq: Once | INTRAMUSCULAR | Status: AC
  Administered 2017-07-25: 10 mg via INTRAVENOUS
  Filled 2017-07-25: qty 1

## 2017-07-25 MED ORDER — SODIUM CHLORIDE 0.9 % IV BOLUS (SEPSIS)
1000.0000 mL | Freq: Once | INTRAVENOUS | Status: AC
  Administered 2017-07-25: 1000 mL via INTRAVENOUS

## 2017-07-25 NOTE — ED Triage Notes (Signed)
C/o sore throat x 3 days-[facility]-pt c/o sore throat is worse and was advised to come to ED-NAD-steady gait

## 2017-07-25 NOTE — ED Notes (Signed)
Patient transported to CT 

## 2017-07-25 NOTE — ED Provider Notes (Signed)
[facility] EMERGENCY DEPARTMENT Provider Note   CSN: [id number] Arrival date & time: 07/25/17  1423     History   Chief Complaint Chief Complaint  Patient presents with  . Sore Throat    HPI Claudia Hughes is a 21 y.o. female.  Patient is a 21 year old female presenting with complaints of sore throat for the past 2 days.  She was seen yesterday at an outside facility.  She had a strep test performed which was negative, then was placed on Augmentin.  They told her if she did not improve to come to the ER to be evaluated.  She reports worsening sore throat that is worse with swallowing.  She denies any difficulty breathing.  She denies any hoarse voice.   The history is provided by the patient.  Sore Throat  This is a new problem. The current episode started 2 days ago. The problem occurs constantly. The problem has been rapidly worsening. Pertinent negatives include no chest pain and no shortness of breath. The symptoms are aggravated by swallowing. Nothing relieves the symptoms. She has tried nothing for the symptoms. The treatment provided no relief.    Past Medical History:  Diagnosis Date  . Acne   . Depression   . Migraines     Patient Active Problem List   Diagnosis Date Noted  . MDD (major depressive disorder), recurrent episode, moderate (HCC) 07/17/2016  . Major depressive disorder, single episode, severe (HCC) 06/12/2016  . Suicidal ideation 06/12/2016  . 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 Migraine without aura, without mention of intractable migraine without mention of status migrainosus 03/19/2013  . Episodic tension type headache 03/19/2013  . Variants of migraine, not elsewhere classified, without mention of intractable migraine without mention of status migrainosus 03/19/2013  . Acne 03/03/2013    Past Surgical History:  Procedure Laterality Date  . WISDOM TOOTH EXTRACTION      OB History    Gravida Para Term Preterm  AB Living   0             SAB TAB Ectopic Multiple Live Births                   Home Medications    Prior to Admission medications   Not on File    Family History Family History  Problem Relation Age of Onset  . Migraines Mother   . Migraines Other        Maternal Great Grandmother    Social History Social History   Tobacco Use  . Smoking status: Former Smoker    Packs/day: 0.25    Years: 0.50    Pack years: 0.12    Types: Cigarettes  . Smokeless tobacco: Never Used  Substance Use Topics  . Alcohol use: No    Alcohol/week: 0.0 oz  . Drug use: Yes    Types: Marijuana     Allergies   Patient has no known allergies.   Review of Systems Review of Systems  Respiratory: Negative for shortness of breath.   Cardiovascular: Negative for chest pain.  All other systems reviewed and are negative.    Physical Exam Updated Vital Signs BP (!) 148/97 (BP Location: Left Arm)   Pulse (!) 109   Temp 98.2 F (36.8 C) (Oral)   Resp 16   LMP 07/24/2017   SpO2 97%   Physical Exam  Constitutional: She is oriented to person, place, and time. She appears well-developed and well-nourished.  No distress.  HENT:  Head: Normocephalic and atraumatic.  Right Ear: Tympanic membrane normal. No drainage.  Left Ear: Tympanic membrane normal. No drainage.  Mouth/Throat: Oropharynx is clear and moist. Mucous membranes are dry. No uvula swelling.  Neck: Normal range of motion. Neck supple.  Cardiovascular: Normal rate and regular rhythm. Exam reveals no gallop and no friction rub.  No murmur heard. Pulmonary/Chest: Effort normal and breath sounds normal. No respiratory distress. She has no wheezes.  Abdominal: Soft. Bowel sounds are normal. She exhibits no distension. There is no tenderness.  Musculoskeletal: Normal range of motion.  Lymphadenopathy:    She has no cervical adenopathy.  Neurological: She is alert and oriented to person, place, and time.  Skin: Skin is warm and  dry. She is not diaphoretic.  Nursing note and vitals reviewed.    ED Treatments / Results  Labs (all labs ordered are listed, but only abnormal results are displayed) Labs Reviewed  RAPID STREP SCREEN (NOT AT [facility])  CULTURE, GROUP A STREP [facility])  BASIC METABOLIC PANEL  CBC WITH DIFFERENTIAL/PLATELET  MONONUCLEOSIS SCREEN  HCG, SERUM, QUALITATIVE    EKG  EKG Interpretation None       Radiology No results found.  Procedures Procedures (including critical care time)  Medications Ordered in ED Medications  sodium chloride 0.9 % bolus 1,000 mL (1,000 mLs Intravenous New Bag/Given 07/25/17 1545)  dexamethasone (DECADRON) injection 10 mg (10 mg Intravenous Given 07/25/17 1544)     Initial Impression / Assessment and Plan / ED Course  I have reviewed the triage vital signs and the nursing notes.  Pertinent labs & imaging results that were available during my care of the patient were reviewed by me and considered in my medical decision making (see chart for details).  Strep and mono tests are both negative.  The patient does have an elevated white count and slightly larger tonsil on the right than the left.  For this reason, a CT scan was performed to rule out the possibility of abscess.  It did show a small, developing abscess within the right tonsil.  This finding was discussed with Dr. Lazarus SalinesWolicki from ENT.  He is recommending steroids, continued Augmentin and follow-up in the office in the next few days.  He is feeling significantly improved after receiving Decadron and IV fluids here in the ER.  Final Clinical Impressions(s) / ED Diagnoses   Final diagnoses:  None    ED Discharge Orders    None       Geoffery Lyonselo, [place] Bills, MD 07/25/17 1752

## 2017-07-25 NOTE — ED Notes (Signed)
Pt reports provider yesterday was worried about possible peritonsilar abscess. Sts 3 doses of antibiotics with no relief.

## 2017-07-25 NOTE — ED Notes (Signed)
Pt to CT at this time.

## 2017-07-25 NOTE — Discharge Instructions (Signed)
Continue Augmentin as prescribed.  Begin taking prednisone as prescribed today.  Follow-up with ear, nose, and throat on Monday, sooner if symptoms are not improving before Friday.  Return to the emergency department if you develop difficulty breathing, worsening pain, and inability to swallow, or other new and concerning symptoms.

## 2017-07-28 LAB — CULTURE, GROUP A STREP (THRC)

## 2019-04-24 IMAGING — CT CT NECK W/ CM
4 series · 14 of 33 positions shown, 17 images · IV contrast (iopamidol)
Comparison: None.

CLINICAL DATA: 21-year-old female with sore throat for 3 days
greater on the right side.

EXAM:
CT NECK WITH CONTRAST
TECHNIQUE: Multidetector CT imaging of the neck was performed using the
standard protocol following the bolus administration of intravenous
contrast.
CONTRAST:  75mL T0TQ4L-YFF IOPAMIDOL (T0TQ4L-YFF) INJECTION 61%

[Series 3: axial neck · axial · 0.45mm/px · z∈[+631,+807]mm · 5 of 133 slices shown, 7 images]
[im 23/133  soft-tissue]
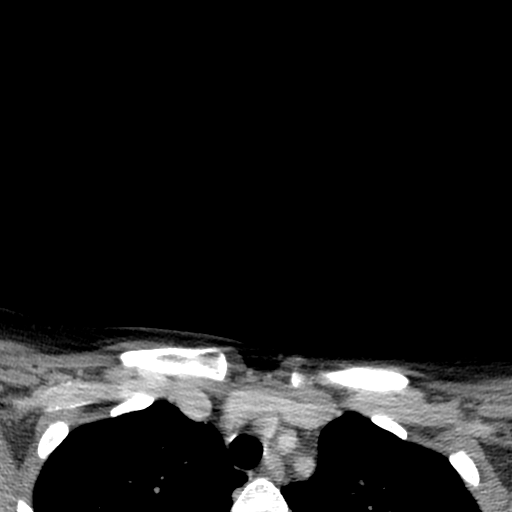
[im 23/133  bone]
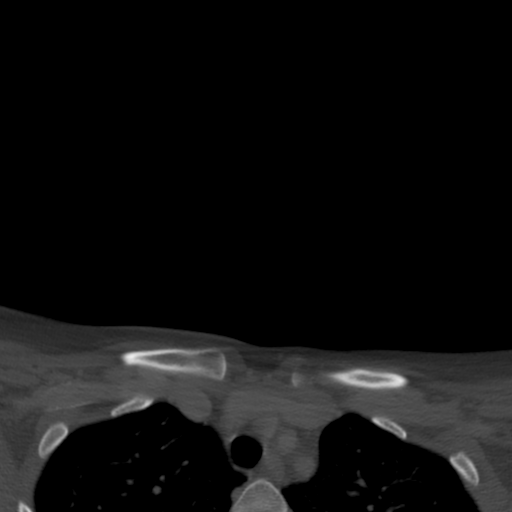
[im 45/133  bone]
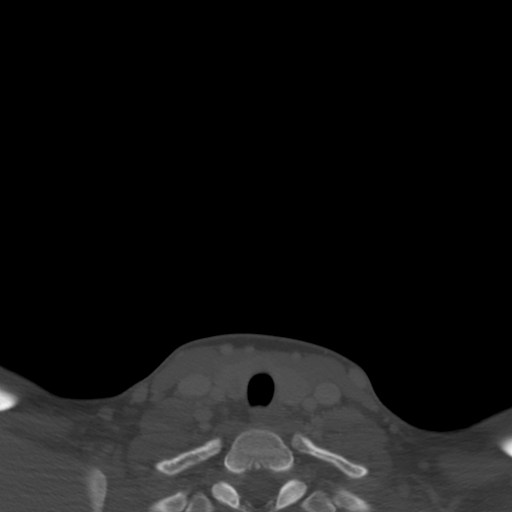
[im 67/133  bone]
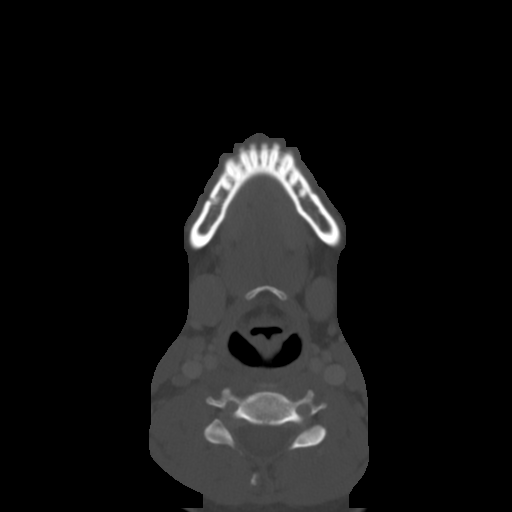
[im 89/133  bone]
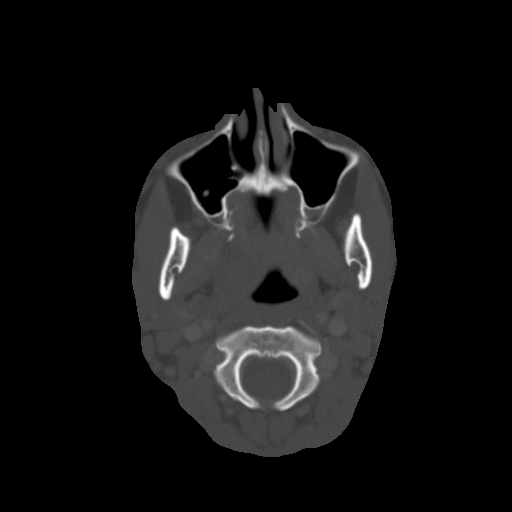
[im 111/133  soft-tissue]
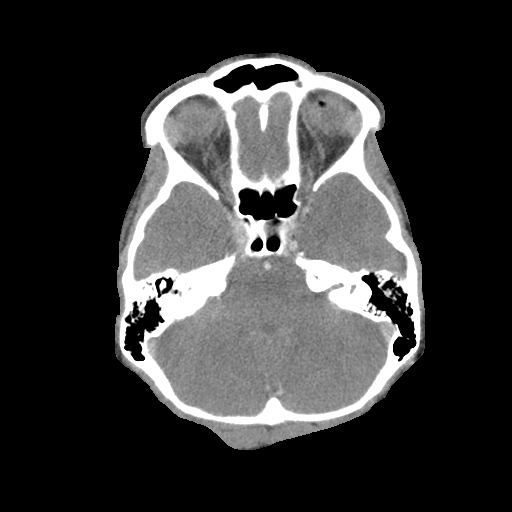
[im 111/133  bone]
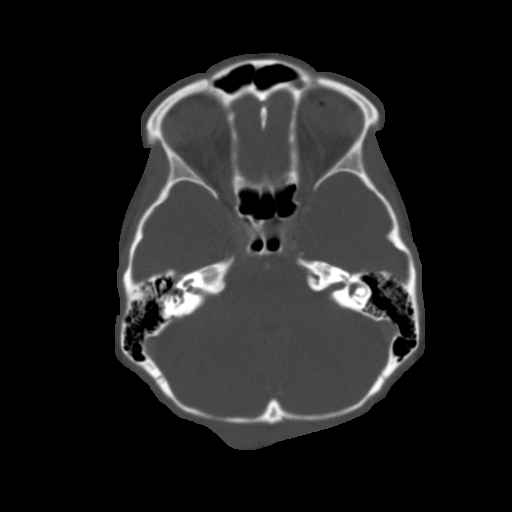

[Series 7: sag neck · sagittal · 0.52mm/px · 5 of 105 slices shown, 6 images]
[im 35/105  bone]
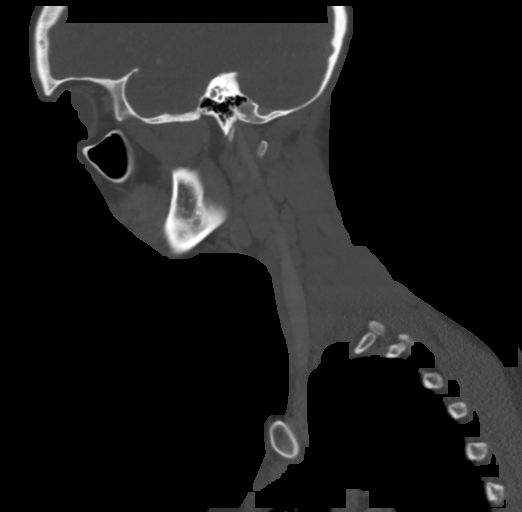
[im 44/105  bone]
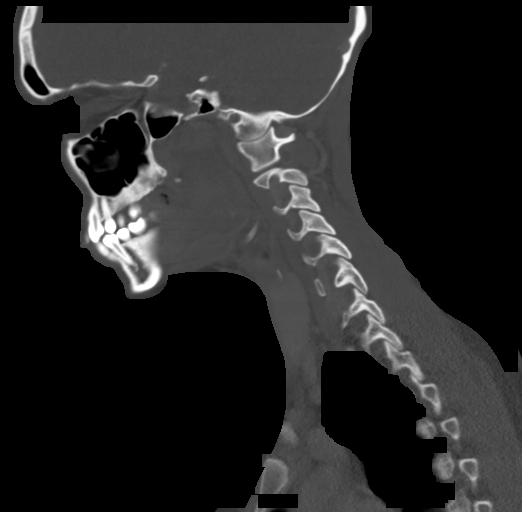
[im 53/105  soft-tissue]
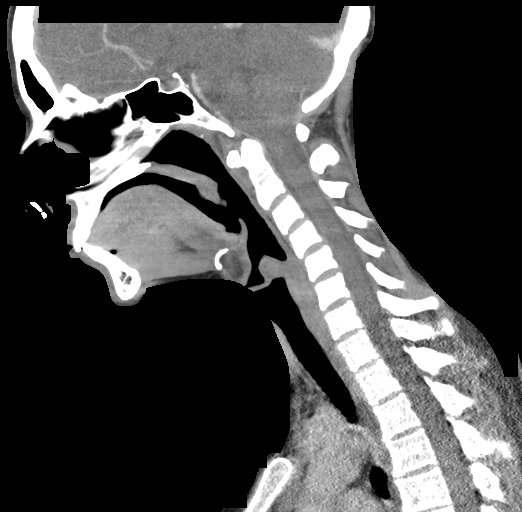
[im 53/105  bone]
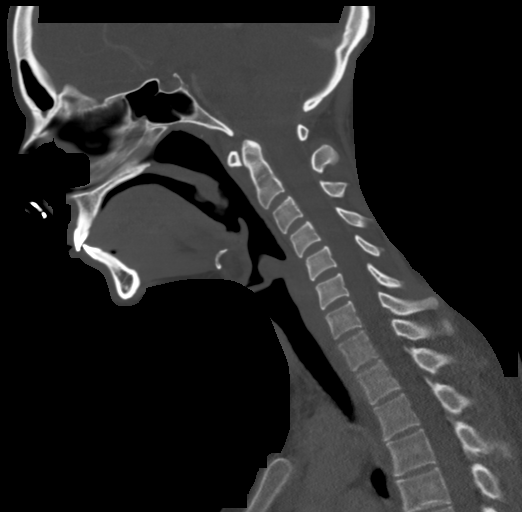
[im 61/105  bone]
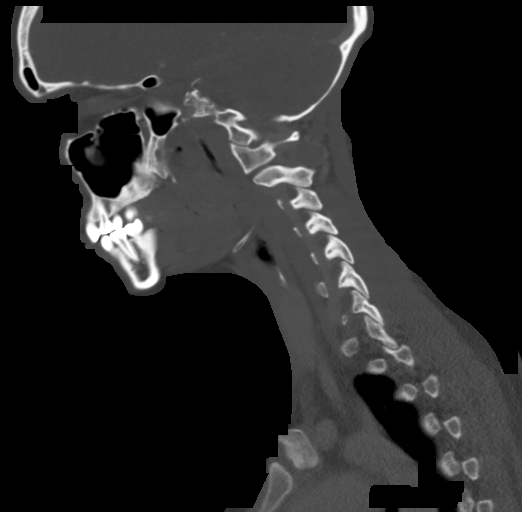
[im 70/105  bone]
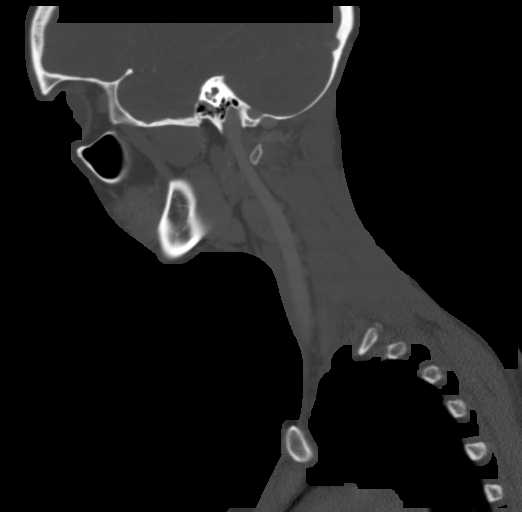

[Series 8: cor neck · coronal · 0.45mm/px · 3 of 132 slices shown]
[im 27/132  bone]
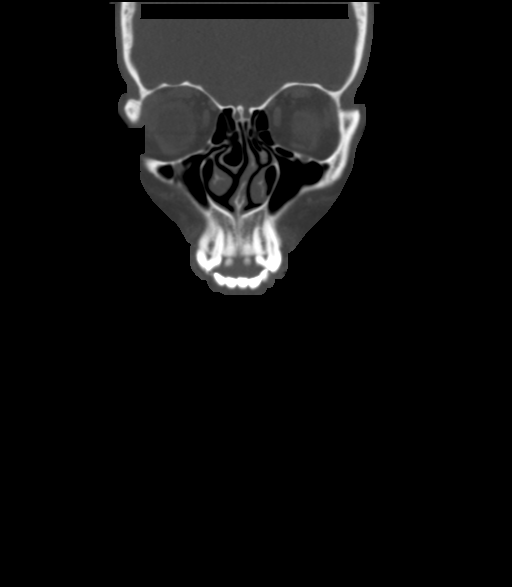
[im 53/132  bone]
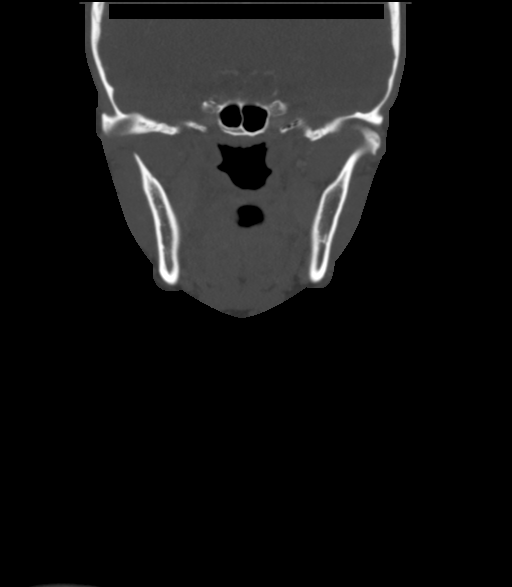
[im 79/132  bone]
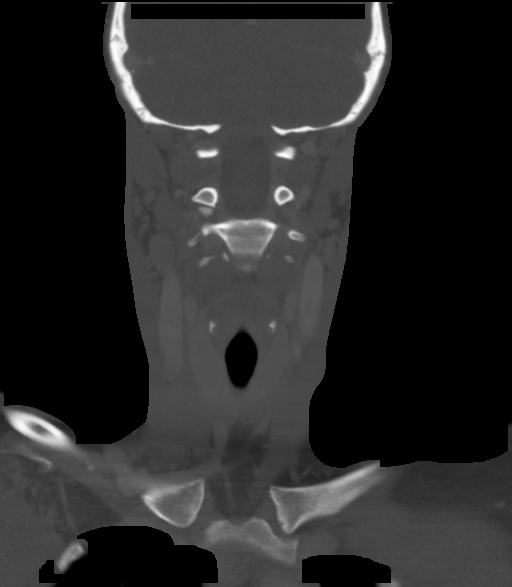

[Series 9: orthogonal ax · axial · 0.39mm/px · 1 of 134 slices shown]
[im 23/134  bone]
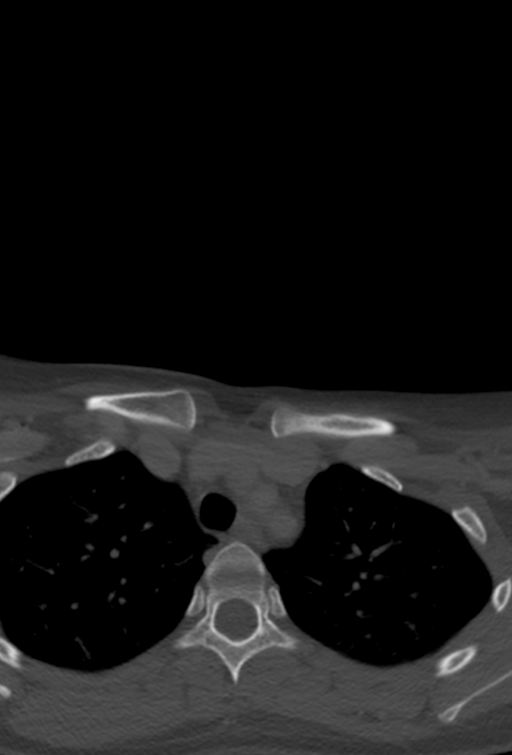

[14 of 33 positions shown; findings below may reference images not displayed]

FINDINGS: Pharynx and larynx: Negative larynx and hypopharynx.

Bulky palatine tonsillar enlargement with striated hyper enhancement
pattern (series 3, image 53). The right palatine tonsil is a
asymmetrically thickened at an above the level of the soft palate
(coronal image 57 and axial image 42 with associated nearby a
parapharyngeal space inflammatory stranding (axial image 39). There
is superimposed asymmetric hypodensity within the superior aspect of
the right palatine tonsil best seen on axial image 48 and coronal
image 58 encompassing 10 x 12 x 7 mm.

The left parapharyngeal space and retropharyngeal spaces are normal.
Adenoids and lingual tonsil appear normal for age.

Salivary glands: Negative sublingual space, submandibular glands and
parotid glands.

Thyroid: Negative; subcentimeter hypodense right thyroid nodule does
not meet consensus criteria for ultrasound follow-up.

Lymph nodes: Reactive appearing bilateral level 2 lymphadenopathy
slightly greater on the right. Nodes measure up to 11 mm short axis.
No cystic or necrotic nodes.

Small nodes may be mildly increased in number also at the bilateral
level 3 nodal stations.

Other nodal stations are within normal limits.

Vascular: Major vascular structures in the neck and at the skullbase
are patent, including both internal jugular veins.

Limited intracranial: Negative.

Visualized orbits: Negative.

Mastoids and visualized paranasal sinuses: Clear.

Skeleton: Negative.

Upper chest: Clear visible lung apices. Normal visualized superior
mediastinum.
IMPRESSION: 1. Acute Tonsillitis. Asymmetric hypodensity in the superior right
palatine tonsil associated with asymmetric edema in the right
tonsillar pillar and the right parapharyngeal space is suggestive of
developing Intra-tonsillar Abscess. See series 3, image 48 and
coronal image 58.
2. Reactive appearing bilateral lymphadenopathy. No suppurative
nodes or other complicating features.
